# Patient Record
Sex: Female | Born: 1960
Health system: Southern US, Community
[De-identification: ages and names within clinical notes are randomized; demographics above are authoritative.]

## PROBLEM LIST (undated history)

## (undated) DIAGNOSIS — E785 Hyperlipidemia, unspecified: Secondary | ICD-10-CM

## (undated) DIAGNOSIS — E079 Disorder of thyroid, unspecified: Secondary | ICD-10-CM

## (undated) DIAGNOSIS — I10 Essential (primary) hypertension: Secondary | ICD-10-CM

## (undated) DIAGNOSIS — C801 Malignant (primary) neoplasm, unspecified: Secondary | ICD-10-CM

## (undated) DIAGNOSIS — D649 Anemia, unspecified: Secondary | ICD-10-CM

## (undated) DIAGNOSIS — Z8619 Personal history of other infectious and parasitic diseases: Secondary | ICD-10-CM

## (undated) DIAGNOSIS — T7840XA Allergy, unspecified, initial encounter: Secondary | ICD-10-CM

## (undated) HISTORY — DX: Allergy, unspecified, initial encounter: T78.40XA

## (undated) HISTORY — DX: Disorder of thyroid, unspecified: E07.9

## (undated) HISTORY — DX: Hyperlipidemia, unspecified: E78.5

## (undated) HISTORY — DX: Malignant (primary) neoplasm, unspecified: C80.1

## (undated) HISTORY — PX: LAPAROSCOPIC ABDOMINAL EXPLORATION: SHX6249

## (undated) HISTORY — DX: Anemia, unspecified: D64.9

## (undated) HISTORY — DX: Personal history of other infectious and parasitic diseases: Z86.19

---

## 2002-02-17 HISTORY — PX: CHOLECYSTECTOMY: SHX55

## 2004-09-28 ENCOUNTER — Ambulatory Visit: Payer: Self-pay

## 2005-02-08 ENCOUNTER — Ambulatory Visit: Payer: Self-pay | Admitting: General Practice

## 2006-02-14 ENCOUNTER — Ambulatory Visit: Payer: Self-pay | Admitting: General Practice

## 2006-02-22 ENCOUNTER — Ambulatory Visit: Payer: Self-pay | Admitting: General Practice

## 2007-02-13 ENCOUNTER — Ambulatory Visit: Payer: Self-pay | Admitting: General Practice

## 2008-03-04 ENCOUNTER — Ambulatory Visit: Payer: Self-pay | Admitting: General Practice

## 2009-03-10 ENCOUNTER — Ambulatory Visit: Payer: Self-pay | Admitting: Unknown Physician Specialty

## 2010-03-16 ENCOUNTER — Ambulatory Visit: Payer: Self-pay | Admitting: Unknown Physician Specialty

## 2011-03-22 ENCOUNTER — Ambulatory Visit: Payer: Self-pay | Admitting: Unknown Physician Specialty

## 2013-03-01 LAB — HM PAP SMEAR: HM Pap smear: NEGATIVE

## 2013-11-07 ENCOUNTER — Ambulatory Visit: Payer: Self-pay | Admitting: Unknown Physician Specialty

## 2014-04-23 ENCOUNTER — Encounter: Payer: Self-pay | Admitting: *Deleted

## 2014-09-05 ENCOUNTER — Ambulatory Visit (INDEPENDENT_AMBULATORY_CARE_PROVIDER_SITE_OTHER): Payer: 59 | Admitting: Physician Assistant

## 2014-09-05 ENCOUNTER — Encounter: Payer: Self-pay | Admitting: Physician Assistant

## 2014-09-05 VITALS — BP 128/74 | HR 94 | Temp 97.8°F | Resp 12 | Ht 59.5 in | Wt 169.0 lb

## 2014-09-05 DIAGNOSIS — L680 Hirsutism: Secondary | ICD-10-CM | POA: Insufficient documentation

## 2014-09-05 DIAGNOSIS — E786 Lipoprotein deficiency: Secondary | ICD-10-CM | POA: Insufficient documentation

## 2014-09-05 DIAGNOSIS — E282 Polycystic ovarian syndrome: Secondary | ICD-10-CM | POA: Insufficient documentation

## 2014-09-05 DIAGNOSIS — Z7189 Other specified counseling: Secondary | ICD-10-CM | POA: Diagnosis not present

## 2014-09-05 DIAGNOSIS — E039 Hypothyroidism, unspecified: Secondary | ICD-10-CM | POA: Insufficient documentation

## 2014-09-05 DIAGNOSIS — J309 Allergic rhinitis, unspecified: Secondary | ICD-10-CM | POA: Insufficient documentation

## 2014-09-05 DIAGNOSIS — E782 Mixed hyperlipidemia: Secondary | ICD-10-CM

## 2014-09-05 DIAGNOSIS — Z7689 Persons encountering health services in other specified circumstances: Secondary | ICD-10-CM

## 2014-09-05 MED ORDER — GEMFIBROZIL 600 MG PO TABS: 600.0000 mg | ORAL_TABLET | Freq: Two times a day (BID) | ORAL | Status: DC

## 2014-09-05 MED ORDER — SPIRONOLACTONE 50 MG PO TABS: 50.0000 mg | ORAL_TABLET | Freq: Every day | ORAL | Status: DC

## 2014-09-05 NOTE — Progress Notes (Signed)
Patient ID: Gail Hunter, female   DOB: 1960-08-22, 54 y.o.   MRN: [id number]    Subjective:  HPI  Patient is here to get established. She is followed by Dr. Arvil Chaco and Thad Ranger at [facility] for her women's health care. She has regular Pap smears and breast exams there. She does have a history of PCOS and is currently on metformin. This has been well controlled. She also has a history of hyperlipidemia with hypertriglyceridemia. She is on gemfibrozil 600 mg twice daily. She just recently had her labs done through the [place] [facility]. Labs are stable from previous. Her triglycerides are better at 209 , previously 259. She does have low HDL.  She has not been able to get her HDL above 30. Most recent labs HDL was 28. She also has hypothyroidism diagnosed by Dr. Randa Lynn. Most recent labs were stable. She is on levothyroxine 150 g daily.  Prior to Admission medications   Medication Sig Start Date End Date Taking? Authorizing Provider  Cholecalciferol (VITAMIN D3) 2000 UNITS capsule Take by mouth. 12/22/09  Yes Historical Provider, MD  fexofenadine (ALLEGRA) 180 MG tablet Take 180 mg by mouth daily.   Yes Historical Provider, MD  furosemide (LASIX) 20 MG tablet Take 20 mg by mouth daily as needed.  12/22/09  Yes Historical Provider, MD  gemfibrozil (LOPID) 600 MG tablet Take by mouth. 12/22/09  Yes Historical Provider, MD  levothyroxine (SYNTHROID, LEVOTHROID) 150 MCG tablet Take by mouth. 12/22/09  Yes Historical Provider, MD  metFORMIN (GLUCOPHAGE) 1000 MG tablet Take by mouth. 12/22/09  Yes Historical Provider, MD  Multiple Vitamin (MULTI-VITAMINS) TABS Take by mouth. 12/22/09  Yes Historical Provider, MD    There are no active problems to display for this patient.   Past Medical History  Diagnosis Date  . Allergy   . Anemia   . Thyroid disease   . History of chicken pox   . History of measles     Social History   Social History  . Marital Status:  Married    Spouse Name: N/A  . Number of Children: N/A  . Years of Education: N/A   Occupational History  . Not on file.   Social History Main Topics  . Smoking status: Former Smoker -- 0.25 packs/day for 2 years    Types: Cigarettes  . Smokeless tobacco: Never Used  . Alcohol Use: No  . Drug Use: No  . Sexual Activity: Not on file   Other Topics Concern  . Not on file   Social History Narrative  . No narrative on file    No Known Allergies  Review of Systems  Constitutional: Negative.   HENT: Negative.   Eyes: Negative.   Respiratory: Negative.   Cardiovascular: Negative.   Gastrointestinal: Negative.   Genitourinary: Negative.   Musculoskeletal: Negative.   Skin: Negative.   Neurological: Negative.   Endo/Heme/Allergies: Negative.   Psychiatric/Behavioral: Negative.     Objective:  BP 128/74 mmHg  Pulse 94  Temp(Src) 97.8 F (36.6 C)  Resp 12  Ht 4' 11.5" (1.511 m)  Wt 169 lb (76.658 kg)  BMI 33.58 kg/m2  LMP 09/05/2014  Physical Exam  Constitutional: She is oriented to person, place, and time and well-developed, well-nourished, and in no distress. No distress.  HENT:  Head: Normocephalic and atraumatic.  Right Ear: Hearing, tympanic membrane, external ear and ear canal normal.  Left Ear: Hearing, tympanic membrane, external ear and ear canal normal.  Nose:  Nose normal.  Mouth/Throat: Uvula is midline, oropharynx is clear and moist and mucous membranes are normal. No oropharyngeal exudate.  Eyes: Conjunctivae and EOM are normal. Pupils are equal, round, and reactive to light. Right eye exhibits no discharge. Left eye exhibits no discharge. No scleral icterus.  Neck: Normal range of motion. Neck supple. No JVD present. No tracheal deviation present. No thyromegaly present.  Cardiovascular: Normal rate, regular rhythm, normal heart sounds and intact distal pulses.  Exam reveals no gallop and no friction rub.   No murmur heard. Pulmonary/Chest: Effort  normal and breath sounds normal. No respiratory distress. She has no wheezes. She has no rales.  Abdominal: Soft. Bowel sounds are normal. She exhibits no distension and no mass. There is no tenderness. There is no rebound and no guarding.  Musculoskeletal: Normal range of motion. She exhibits no edema or tenderness.  Lymphadenopathy:    She has no cervical adenopathy.  Neurological: She is alert and oriented to person, place, and time. No cranial nerve deficit. Gait normal. Coordination normal.  Skin: Skin is warm and dry. No rash noted. She is not diaphoretic.  Psychiatric: Mood, memory, affect and judgment normal.  Vitals reviewed.    Assessment and Plan :  1. Establishing care with new doctor, encounter for  2. PCOS (polycystic ovarian syndrome)  Has been stable with metformin. She is followed by Dr. Arvil Chaco. She had been previously on Aldactone with oral contraceptives and metformin for the PCOS and hirsutism. She discontinued the Aldactone and oral contraceptives a few years ago. She is interested in restarting the Aldactone for the hirsutism of the face but does not wish to restart oral contraceptives. She is aware of the risk associated with taking Aldactone and pregnancy. She is not menopausal yet. - spironolactone (ALDACTONE) 50 MG tablet; Take 1 tablet (50 mg total) by mouth daily.  Dispense: 90 tablet; Refill: 4  3. Hypercholesterolemia with hypertriglyceridemia Stable. Continue gemfibrozil as below. Will recheck in 6 months. - gemfibrozil (LOPID) 600 MG tablet; Take 1 tablet (600 mg total) by mouth 2 (two) times daily before a meal.  Dispense: 120 tablet; Refill: 4  4. Hirsutism Secondary to PCOS. She would like to try the Aldactone again without the oral contraceptive. She was advised of pregnancy risk factors since she is still having a regular menstrual cycle. She voiced understanding of the precautions that would be necessary to prevent pregnancy.  Aldactone prescribed as  below. We'll recheck in 6 months. - spironolactone (ALDACTONE) 50 MG tablet; Take 1 tablet (50 mg total) by mouth daily.  Dispense: 90 tablet; Refill: 4  5. Hypothyroidism, unspecified hypothyroidism type Stable on levothyroxine 150 g. Most recent labs were stable. We'll recheck in 6 months.  6. Low HDL (under 40) Most recent HDL was 28. This is a stable reading for her.  She does mention that her brother and mother both have low HDLs as well.  Advised to try to add some daily exercise some increase. Will recheck labs in 6 months.

## 2014-09-05 NOTE — Patient Instructions (Signed)

## 2014-09-17 ENCOUNTER — Encounter: Payer: Self-pay | Admitting: Physician Assistant

## 2015-04-24 DIAGNOSIS — Z01419 Encounter for gynecological examination (general) (routine) without abnormal findings: Secondary | ICD-10-CM | POA: Diagnosis not present

## 2015-09-08 ENCOUNTER — Other Ambulatory Visit: Payer: Self-pay | Admitting: Physician Assistant

## 2015-09-08 DIAGNOSIS — L680 Hirsutism: Secondary | ICD-10-CM

## 2015-09-08 DIAGNOSIS — E282 Polycystic ovarian syndrome: Secondary | ICD-10-CM

## 2015-09-18 DIAGNOSIS — H524 Presbyopia: Secondary | ICD-10-CM | POA: Diagnosis not present

## 2015-10-16 ENCOUNTER — Ambulatory Visit (INDEPENDENT_AMBULATORY_CARE_PROVIDER_SITE_OTHER): Payer: 59 | Admitting: Physician Assistant

## 2015-10-16 ENCOUNTER — Encounter: Payer: Self-pay | Admitting: Physician Assistant

## 2015-10-16 VITALS — BP 112/70 | HR 74 | Temp 98.3°F | Resp 16 | Ht 60.0 in | Wt 173.0 lb

## 2015-10-16 DIAGNOSIS — Z1211 Encounter for screening for malignant neoplasm of colon: Secondary | ICD-10-CM | POA: Diagnosis not present

## 2015-10-16 DIAGNOSIS — Z1239 Encounter for other screening for malignant neoplasm of breast: Secondary | ICD-10-CM | POA: Diagnosis not present

## 2015-10-16 DIAGNOSIS — Z0000 Encounter for general adult medical examination without abnormal findings: Secondary | ICD-10-CM

## 2015-10-16 LAB — POCT URINALYSIS DIPSTICK
BILIRUBIN UA: NEGATIVE
GLUCOSE UA: NEGATIVE
KETONES UA: NEGATIVE
Leukocytes, UA: NEGATIVE
Nitrite, UA: NEGATIVE
Protein, UA: NEGATIVE
RBC UA: NEGATIVE
SPEC GRAV UA: 1.01
pH, UA: 5

## 2015-10-16 NOTE — Patient Instructions (Signed)

## 2015-10-16 NOTE — Progress Notes (Signed)
Patient: Gail Hunter, Female    DOB: 12-04-1960, 55 y.o.   MRN: [id number] Visit Date: 10/16/2015  Today's Provider: Margaretann Loveless, PA-C   Chief Complaint  Patient presents with  . Annual Exam   Subjective:    Annual physical exam Gail Hunter is a 55 y.o. female who presents today for health maintenance and complete physical. She feels well. She reports exercising 3 days a week. She reports she is sleeping well. 04/17: Pap-neg per pt Dr. Haskel Khan 01/07/14:  Mammogram-BI-RADS 1 -----------------------------------------------------------------   Review of Systems  Constitutional: Negative.   HENT: Negative.   Eyes: Negative.   Respiratory: Negative.   Cardiovascular: Negative.   Gastrointestinal: Negative.   Endocrine: Negative.   Genitourinary: Negative.   Musculoskeletal: Negative.   Skin: Negative.   Allergic/Immunologic: Negative.   Neurological: Negative.   Hematological: Negative.   Psychiatric/Behavioral: Negative.     Social History      She  reports that she quit smoking about 15 years ago. Her smoking use included Cigarettes. She has a 0.50 pack-year smoking history. She has never used smokeless tobacco. She reports that she does not drink alcohol or use drugs.       Social History   Social History  . Marital status: Married    Spouse name: N/A  . Number of children: 1  . Years of education: N/A   Social History Main Topics  . Smoking status: Former Smoker    Packs/day: 0.25    Years: 2.00    Types: Cigarettes    Quit date: 01/17/2000  . Smokeless tobacco: Never Used  . Alcohol use No  . Drug use: No  . Sexual activity: Not Asked   Other Topics Concern  . None   Social History Narrative  . None    Past Medical History:  Diagnosis Date  . Allergy   . Anemia   . History of chicken pox   . History of measles   . Thyroid disease      Patient Active Problem List   Diagnosis Date Noted  . PCOS (polycystic ovarian  syndrome) 09/05/2014  . Hypercholesterolemia with hypertriglyceridemia 09/05/2014  . Hirsutism 09/05/2014  . Hypothyroidism 09/05/2014  . Allergic rhinitis 09/05/2014  . Low HDL (under 40) 09/05/2014    Past Surgical History:  Procedure Laterality Date  . CESAREAN SECTION    . CHOLECYSTECTOMY  02/2002  . LAPAROSCOPIC ABDOMINAL EXPLORATION      Family History        Family Status  Relation Status  . Mother Alive  . Father Alive  . Sister Alive  . Brother Alive  . Daughter Alive        Her family history includes Alzheimer's disease in her father and mother; Diabetes in her sister; Hyperlipidemia in her brother; Thyroid disease in her mother.    No Known Allergies  Current Meds  Medication Sig  . cetirizine (ZYRTEC) 10 MG tablet Take 10 mg by mouth daily.  . metformin (FORTAMET) 1000 MG (OSM) 24 hr tablet TAKE 1 TABLET BY MOUTH ONCE DAILY WITH BREAKFAST    Patient Care Team: Margaretann Loveless, PA-C as PCP - General (Physician Assistant)     Objective:   Vitals: BP 112/70 (BP Location: Left Arm, Patient Position: Sitting, Cuff Size: Large)   Pulse 74   Temp 98.3 F (36.8 C) (Oral)   Resp 16   Ht 5' (1.524 m)   Wt 173 lb (78.5 kg)  SpO2 97%   BMI 33.79 kg/m    Physical Exam  Constitutional: She is oriented to person, place, and time. She appears well-developed and well-nourished. No distress.  HENT:  Head: Normocephalic and atraumatic.  Right Ear: Tympanic membrane, external ear and ear canal normal.  Left Ear: Tympanic membrane, external ear and ear canal normal.  Nose: Nose normal.  Mouth/Throat: Uvula is midline, oropharynx is clear and moist and mucous membranes are normal. No oropharyngeal exudate.  Eyes: Conjunctivae and EOM are normal. Pupils are equal, round, and reactive to light. Right eye exhibits no discharge. Left eye exhibits no discharge. No scleral icterus.  Neck: Normal range of motion. Neck supple. No JVD present. Carotid bruit is not  present. No tracheal deviation present. No thyromegaly present.  Cardiovascular: Normal rate, regular rhythm, normal heart sounds and intact distal pulses.  Exam reveals no gallop and no friction rub.   No murmur heard. Pulmonary/Chest: Effort normal and breath sounds normal. No respiratory distress. She has no wheezes. She has no rales. She exhibits no tenderness. Right breast exhibits no inverted nipple, no mass, no nipple discharge, no skin change and no tenderness. Left breast exhibits no inverted nipple, no mass, no nipple discharge, no skin change and no tenderness. Breasts are symmetrical.  Abdominal: Soft. Bowel sounds are normal. She exhibits no distension and no mass. There is no tenderness. There is no rebound and no guarding.  Musculoskeletal: Normal range of motion. She exhibits no edema or tenderness.  Lymphadenopathy:    She has no cervical adenopathy.  Neurological: She is alert and oriented to person, place, and time.  Skin: Skin is warm and dry. No rash noted. She is not diaphoretic.  Psychiatric: She has a normal mood and affect. Her behavior is normal. Judgment and thought content normal.  Vitals reviewed.  Depression Screen PHQ 2/9 Scores 10/16/2015  PHQ - 2 Score 0    Assessment & Plan:     Routine Health Maintenance and Physical Exam  Exercise Activities and Dietary recommendations Goals    None       There is no immunization history on file for this patient.  Health Maintenance  Topic Date Due  . Hepatitis C Screening  06-Feb-1960  . HIV Screening  11/21/1976  . TETANUS/TDAP  11/21/1980  . PAP SMEAR  11/22/1982  . MAMMOGRAM  11/22/2011  . COLONOSCOPY  11/22/2011  . INFLUENZA VACCINE  08/18/2015      Discussed health benefits of physical activity, and encouraged her to engage in regular exercise appropriate for her age and condition.    1. Annual physical exam UA was normal today in the office. Labs previously done and discussed. I will see her back  in 1 year. She is to call if she develops any acute issue, question or concern in the meantime. - POCT urinalysis dipstick  2. Breast cancer screening Breast exam today was normal. There is no family history of breast cancer. She does perform regular self breast exams. Mammogram was ordered as below. [facility] was given to patient so she may schedule her mammogram at her convenience. - MM Digital Screening; Future  3. Colon cancer screening - Ambulatory referral to Gastroenterology  --------------------------------------------------------------------    Margaretann LovelessJennifer M Stefan Markarian, PA-C  [facility]

## 2015-10-20 ENCOUNTER — Encounter: Payer: Self-pay | Admitting: Physician Assistant

## 2015-10-23 ENCOUNTER — Encounter: Payer: Self-pay | Admitting: Physician Assistant

## 2015-11-19 DIAGNOSIS — Z1211 Encounter for screening for malignant neoplasm of colon: Secondary | ICD-10-CM | POA: Diagnosis not present

## 2015-12-18 ENCOUNTER — Ambulatory Visit
Admission: RE | Admit: 2015-12-18 | Discharge: 2015-12-18 | Disposition: A | Discharge status: Home / Self Care | Payer: 59 | Source: Ambulatory Visit | Attending: Physician Assistant | Admitting: Physician Assistant

## 2015-12-18 DIAGNOSIS — Z1231 Encounter for screening mammogram for malignant neoplasm of breast: Secondary | ICD-10-CM | POA: Diagnosis not present

## 2015-12-18 DIAGNOSIS — Z1239 Encounter for other screening for malignant neoplasm of breast: Secondary | ICD-10-CM

## 2016-01-04 ENCOUNTER — Other Ambulatory Visit: Payer: Self-pay | Admitting: Physician Assistant

## 2016-01-04 DIAGNOSIS — E782 Mixed hyperlipidemia: Secondary | ICD-10-CM

## 2016-03-31 ENCOUNTER — Ambulatory Visit: Admit: 2016-03-31 | Payer: 59 | Admitting: Gastroenterology

## 2016-03-31 DIAGNOSIS — Z1211 Encounter for screening for malignant neoplasm of colon: Secondary | ICD-10-CM | POA: Diagnosis not present

## 2016-03-31 DIAGNOSIS — K648 Other hemorrhoids: Secondary | ICD-10-CM | POA: Diagnosis not present

## 2016-03-31 LAB — HM COLONOSCOPY

## 2016-03-31 SURGERY — COLONOSCOPY WITH PROPOFOL
Anesthesia: General

## 2016-07-14 ENCOUNTER — Telehealth: Payer: Self-pay | Admitting: Physician Assistant

## 2016-07-14 DIAGNOSIS — E282 Polycystic ovarian syndrome: Secondary | ICD-10-CM

## 2016-07-14 MED ORDER — METFORMIN HCL ER 500 MG PO TB24: 1000.0000 mg | ORAL_TABLET | Freq: Every day | ORAL | 3 refills | Status: DC

## 2016-07-14 NOTE — Telephone Encounter (Signed)
Metformin ER 1000mg  not covered. Will send in metformin ER 500mg  to take 2 tabs po daily as this is covered.

## 2016-09-22 ENCOUNTER — Other Ambulatory Visit: Payer: Self-pay | Admitting: Physician Assistant

## 2016-09-22 DIAGNOSIS — L680 Hirsutism: Secondary | ICD-10-CM

## 2016-09-22 DIAGNOSIS — E282 Polycystic ovarian syndrome: Secondary | ICD-10-CM

## 2016-10-21 ENCOUNTER — Ambulatory Visit (INDEPENDENT_AMBULATORY_CARE_PROVIDER_SITE_OTHER): Payer: 59 | Admitting: Physician Assistant

## 2016-10-21 ENCOUNTER — Encounter: Payer: Self-pay | Admitting: Physician Assistant

## 2016-10-21 VITALS — BP 120/70 | HR 92 | Temp 98.3°F | Resp 16 | Ht 60.0 in | Wt 174.2 lb

## 2016-10-21 DIAGNOSIS — E782 Mixed hyperlipidemia: Secondary | ICD-10-CM | POA: Diagnosis not present

## 2016-10-21 DIAGNOSIS — Z0000 Encounter for general adult medical examination without abnormal findings: Secondary | ICD-10-CM

## 2016-10-21 DIAGNOSIS — E039 Hypothyroidism, unspecified: Secondary | ICD-10-CM | POA: Diagnosis not present

## 2016-10-21 DIAGNOSIS — Z1211 Encounter for screening for malignant neoplasm of colon: Secondary | ICD-10-CM

## 2016-10-21 DIAGNOSIS — Z1239 Encounter for other screening for malignant neoplasm of breast: Secondary | ICD-10-CM

## 2016-10-21 DIAGNOSIS — E282 Polycystic ovarian syndrome: Secondary | ICD-10-CM

## 2016-10-21 DIAGNOSIS — Z1231 Encounter for screening mammogram for malignant neoplasm of breast: Secondary | ICD-10-CM | POA: Diagnosis not present

## 2016-10-21 NOTE — Patient Instructions (Signed)

## 2016-10-21 NOTE — Progress Notes (Signed)
Patient: Gail Hunter, Female    DOB: 1960-04-29, 56 y.o.   MRN: [id number] Visit Date: 10/21/2016  Today's Provider: Margaretann Loveless, PA-C   Chief Complaint  Patient presents with  . Annual Exam   Subjective:    Annual physical exam Gail Hunter is a 56 y.o. female who presents today for health maintenance and complete physical. She feels well. She reports exercising. She reports she is sleeping well. Per patient she had Colonoscopy done 03/31/2016 @ [facility]. She reports that she has not been to Gyn because Dr.VanDalen has retired. She will switch her GYN care to here now. ----------------------------------------------------------------- Flu Vaccine: At the hospital 10/11/2016 Tdap: 09/2016- Labs reviewed.  Review of Systems  Constitutional: Negative.   HENT: Negative.   Eyes: Negative.   Respiratory: Negative.   Cardiovascular: Negative.   Gastrointestinal: Negative.   Endocrine: Negative.   Genitourinary: Negative.   Musculoskeletal: Negative.   Skin: Negative.   Allergic/Immunologic: Negative.   Neurological: Negative.   Hematological: Negative.   Psychiatric/Behavioral: Negative.     Social History      She  reports that she quit smoking about 16 years ago. Her smoking use included Cigarettes. She has a 0.50 pack-year smoking history. She has never used smokeless tobacco. She reports that she does not drink alcohol or use drugs.       Social History   Social History  . Marital status: Married    Spouse name: N/A  . Number of children: 1  . Years of education: N/A   Social History Main Topics  . Smoking status: Former Smoker    Packs/day: 0.25    Years: 2.00    Types: Cigarettes    Quit date: 01/17/2000  . Smokeless tobacco: Never Used  . Alcohol use No  . Drug use: No  . Sexual activity: Not Asked   Other Topics Concern  . None   Social History Narrative  . None    Past Medical History:  Diagnosis Date  . Allergy    . Anemia   . History of chicken pox   . History of measles   . Thyroid disease      Patient Active Problem List   Diagnosis Date Noted  . PCOS (polycystic ovarian syndrome) 09/05/2014  . Hypercholesterolemia with hypertriglyceridemia 09/05/2014  . Hirsutism 09/05/2014  . Hypothyroidism 09/05/2014  . Allergic rhinitis 09/05/2014  . Low HDL (under 40) 09/05/2014    Past Surgical History:  Procedure Laterality Date  . CESAREAN SECTION    . CHOLECYSTECTOMY  02/2002  . LAPAROSCOPIC ABDOMINAL EXPLORATION      Family History        Family Status  Relation Status  . Mother Alive  . Father Deceased       died 02/05/16  . Sister Alive  . Brother Alive  . Daughter Alive        Her family history includes Alzheimer's disease in her father and mother; Diabetes in her sister; Hyperlipidemia in her brother; Thyroid disease in her mother.     No Known Allergies   Current Outpatient Prescriptions:  .  cetirizine (ZYRTEC) 10 MG tablet, Take 10 mg by mouth daily., Disp: , Rfl:  .  Cholecalciferol (VITAMIN D3) 5000 units TABS, Take by mouth., Disp: , Rfl:  .  gemfibrozil (LOPID) 600 MG tablet, TAKE 1 TABLET BY MOUTH 2 TIMES DAILY BEFORE A MEAL., Disp: 180 tablet, Rfl: 3 .  levothyroxine (SYNTHROID, LEVOTHROID)  150 MCG tablet, Take 150 mcg by mouth daily before breakfast. , Disp: , Rfl:  .  metFORMIN (GLUCOPHAGE-XR) 500 MG 24 hr tablet, Take 2 tablets (1,000 mg total) by mouth daily with breakfast., Disp: 180 tablet, Rfl: 3 .  Multiple Vitamin (MULTIVITAMINS PO), Take by mouth., Disp: , Rfl:  .  spironolactone (ALDACTONE) 50 MG tablet, TAKE 1 TABLET BY MOUTH DAILY., Disp: 90 tablet, Rfl: 4   Patient Care Team: Margaretann Loveless, PA-C as PCP - General (Physician Assistant)      Objective:   Vitals: BP 120/70 (BP Location: Left Arm, Patient Position: Sitting, Cuff Size: Normal)   Pulse 92   Temp 98.3 F (36.8 C) (Oral)   Resp 16   Ht 5' (1.524 m)   Wt 174 lb 3.2 oz (79 kg)    BMI 34.02 kg/m      Physical Exam  Constitutional: She is oriented to person, place, and time. She appears well-developed and well-nourished. No distress.  HENT:  Head: Normocephalic and atraumatic.  Right Ear: Hearing, tympanic membrane, external ear and ear canal normal.  Left Ear: Hearing, tympanic membrane, external ear and ear canal normal.  Nose: Nose normal.  Mouth/Throat: Uvula is midline, oropharynx is clear and moist and mucous membranes are normal. No oropharyngeal exudate.  Eyes: Pupils are equal, round, and reactive to light. Conjunctivae and EOM are normal. Right eye exhibits no discharge. Left eye exhibits no discharge. No scleral icterus.  Neck: Normal range of motion. Neck supple. No JVD present. Carotid bruit is not present. No tracheal deviation present. No thyromegaly present.  Cardiovascular: Normal rate, regular rhythm, normal heart sounds and intact distal pulses.  Exam reveals no gallop and no friction rub.   No murmur heard. Pulmonary/Chest: Effort normal and breath sounds normal. No respiratory distress. She has no wheezes. She has no rales. She exhibits no tenderness. Right breast exhibits no inverted nipple, no mass, no nipple discharge, no skin change and no tenderness. Left breast exhibits no inverted nipple, no mass, no nipple discharge, no skin change and no tenderness. Breasts are symmetrical.  Abdominal: Soft. Bowel sounds are normal. She exhibits no distension and no mass. There is no tenderness. There is no rebound and no guarding.  Musculoskeletal: Normal range of motion. She exhibits no edema or tenderness.  Lymphadenopathy:    She has no cervical adenopathy.  Neurological: She is alert and oriented to person, place, and time. She has normal reflexes.  Skin: Skin is warm and dry. No rash noted. She is not diaphoretic.  Psychiatric: She has a normal mood and affect. Her behavior is normal. Judgment and thought content normal.  Vitals  reviewed.    Depression Screen PHQ 2/9 Scores 10/21/2016 10/16/2015  PHQ - 2 Score 0 0      Assessment & Plan:     Routine Health Maintenance and Physical Exam  Exercise Activities and Dietary recommendations Goals    None       There is no immunization history on file for this patient.  Health Maintenance  Topic Date Due  . Hepatitis C Screening  01/13/1961  . HIV Screening  11/21/1976  . TETANUS/TDAP  11/21/1980  . PAP SMEAR  11/22/1982  . COLONOSCOPY  11/22/2011  . INFLUENZA VACCINE  08/17/2016  . MAMMOGRAM  12/17/2017     Discussed health benefits of physical activity, and encouraged her to engage in regular exercise appropriate for her age and condition.   1. Annual physical exam Normal physical exam  today. Labs reviewed today and will be scanned in chart.   2. Breast cancer screening Breast exam today was normal. There is no family history of breast cancer. She does perform regular self breast exams. Mammogram was ordered as below. Information for [facility] was given to patient so she may schedule her mammogram at her convenience. - MM Digital Screening; Future  3. Colon cancer screening Done with Dr. Mechele Collin at [facility]. Colonoscopy report requested.  4. PCOS (polycystic ovarian syndrome) Stable with metformin and spironolactone. Will check FSH and LH to see if perimenopausal. Patient has not had menstrual cycle since March 2018.  5. Hypothyroidism, unspecified type TSH was elevated at 8.900. Patient does admit to not taking regularly. Currently on levothyroxine . She will have TSH rechecked in 4-6 weeks to see if it returns to normal. If not back to normal will increase dose to .   6. Hypercholesterolemia with hypertriglyceridemia Slight elevation when compared to last year. Patient admits to only taking Gemfibrozil once daily instead of twice daily. She is trying to get better at taking more regularly BID. Also continues to work  on lifestyle modifications with healthy dieting and exercise.   --------------------------------------------------------------------    Margaretann Loveless, PA-C  [facility]

## 2016-10-28 ENCOUNTER — Encounter: Payer: Self-pay | Admitting: Physician Assistant

## 2016-10-31 ENCOUNTER — Encounter: Payer: Self-pay | Admitting: Physician Assistant

## 2016-11-01 ENCOUNTER — Encounter: Payer: Self-pay | Admitting: Physician Assistant

## 2016-11-16 ENCOUNTER — Telehealth: Payer: Self-pay | Admitting: Physician Assistant

## 2016-11-16 NOTE — Telephone Encounter (Signed)
EROR/MW

## 2016-12-07 ENCOUNTER — Other Ambulatory Visit: Payer: Self-pay | Admitting: Physician Assistant

## 2016-12-07 MED ORDER — LEVOTHYROXINE SODIUM 150 MCG PO TABS: 137.0000 ug | ORAL_TABLET | Freq: Every day | ORAL | 12 refills | Status: DC

## 2016-12-07 NOTE — Telephone Encounter (Signed)
[facility] fax[facility] request for the following medication. Thanks CC  levothyroxine (SYNTHROID, LEVOTHROID) 150 MCG tablet

## 2017-04-24 ENCOUNTER — Telehealth: Payer: Self-pay | Admitting: Physician Assistant

## 2017-04-24 NOTE — Telephone Encounter (Signed)
Faxed ROI to [facility] on 10.18.18 / 11/08/2016 and 11/10/2016

## 2017-10-27 ENCOUNTER — Encounter: Payer: Self-pay | Admitting: Physician Assistant

## 2017-11-02 DIAGNOSIS — H524 Presbyopia: Secondary | ICD-10-CM | POA: Diagnosis not present

## 2017-11-03 ENCOUNTER — Other Ambulatory Visit (HOSPITAL_COMMUNITY)
Admission: RE | Admit: 2017-11-03 | Discharge: 2017-11-03 | Disposition: A | Discharge status: Home / Self Care | Payer: 59 | Source: Ambulatory Visit | Attending: Physician Assistant | Admitting: Physician Assistant

## 2017-11-03 ENCOUNTER — Ambulatory Visit (INDEPENDENT_AMBULATORY_CARE_PROVIDER_SITE_OTHER): Payer: 59 | Admitting: Physician Assistant

## 2017-11-03 ENCOUNTER — Encounter: Payer: Self-pay | Admitting: Physician Assistant

## 2017-11-03 VITALS — BP 128/70 | HR 85 | Temp 98.2°F | Resp 16 | Wt 170.8 lb

## 2017-11-03 DIAGNOSIS — Z124 Encounter for screening for malignant neoplasm of cervix: Secondary | ICD-10-CM

## 2017-11-03 DIAGNOSIS — Z0000 Encounter for general adult medical examination without abnormal findings: Secondary | ICD-10-CM | POA: Diagnosis not present

## 2017-11-03 DIAGNOSIS — R7989 Other specified abnormal findings of blood chemistry: Secondary | ICD-10-CM | POA: Diagnosis not present

## 2017-11-03 DIAGNOSIS — Z1239 Encounter for other screening for malignant neoplasm of breast: Secondary | ICD-10-CM | POA: Diagnosis not present

## 2017-11-03 DIAGNOSIS — E039 Hypothyroidism, unspecified: Secondary | ICD-10-CM | POA: Diagnosis not present

## 2017-11-03 MED ORDER — LEVOTHYROXINE SODIUM 137 MCG PO TABS: 137.0000 ug | ORAL_TABLET | Freq: Every day | ORAL | 1 refills | Status: DC

## 2017-11-03 NOTE — Progress Notes (Signed)
Patient: Gail Hunter, Female    DOB: 1960/03/23, 57 y.o.   MRN: [id number] Visit Date: 11/03/2017  Today's Provider: Margaretann Loveless, PA-C   Chief Complaint  Patient presents with  . Annual Exam   Subjective:    Annual physical exam [name] is a 57 y.o. female who presents today for health maintenance and complete physical. She feels well. She reports exercising since August been walking 2-3 days. She reports she is sleeping well.  Labs reviewed from [place] [facility] [place]. Thyroid slightly overcorrected. Triglycerides improving with lifestyle changes. Creatinine slightly increased from baseline.  -----------------------------------------------------------------   Review of Systems  Constitutional: Positive for activity change.  HENT: Negative.   Eyes: Negative.   Respiratory: Negative.   Cardiovascular: Negative.   Gastrointestinal: Negative.   Endocrine: Negative.   Genitourinary: Negative.   Musculoskeletal: Negative.   Skin: Negative.   Allergic/Immunologic: Positive for environmental allergies.  Neurological: Negative.   Hematological: Negative.   Psychiatric/Behavioral: Negative.     Social History      She  reports that she quit smoking about 17 years ago. Her smoking use included cigarettes. She has a 0.50 pack-year smoking history. She has never used smokeless tobacco. She reports that she does not drink alcohol or use drugs.       Social History   Socioeconomic History  . Marital status: Married    Spouse name: Not on file  . Number of children: 1  . Years of education: Not on file  . Highest education level: Not on file  Occupational History  . Not on file  Social Needs  . Financial resource strain: Not on file  . Food insecurity:    Worry: Not on file    Inability: Not on file  . Transportation needs:    Medical: Not on file    Non-medical: Not on file  Tobacco Use  . Smoking status: Former Smoker    Packs/day: 0.25    Years:  2.00    Pack years: 0.50    Types: Cigarettes    Last attempt to quit: 01/17/2000    Years since quitting: 17.8  . Smokeless tobacco: Never Used  Substance and Sexual Activity  . Alcohol use: No  . Drug use: No  . Sexual activity: Not on file  Lifestyle  . Physical activity:    Days per week: Not on file    Minutes per session: Not on file  . Stress: Not on file  Relationships  . Social connections:    Talks on phone: Not on file    Gets together: Not on file    Attends religious service: Not on file    Active member of club or organization: Not on file    Attends meetings of clubs or organizations: Not on file    Relationship status: Not on file  Other Topics Concern  . Not on file  Social History Narrative  . Not on file    Past Medical History:  Diagnosis Date  . Allergy   . Anemia   . History of chicken pox   . History of measles   . Thyroid disease      Patient Active Problem List   Diagnosis Date Noted  . PCOS (polycystic ovarian syndrome) 09/05/2014  . Hypercholesterolemia with hypertriglyceridemia 09/05/2014  . Hirsutism 09/05/2014  . Hypothyroidism 09/05/2014  . Allergic rhinitis 09/05/2014  . Low HDL (under 40) 09/05/2014    Past Surgical History:  Procedure Laterality Date  . CESAREAN SECTION    . CHOLECYSTECTOMY  02/2002  . LAPAROSCOPIC ABDOMINAL EXPLORATION      Family History        Family Status  Relation Name Status  . Mother  Alive  . Father  Deceased       died February 03, 2016  . Sister  Alive  . Brother  Alive  . Daughter  Alive        Her family history includes Alzheimer's disease in her father and mother; Diabetes in her sister; Hyperlipidemia in her brother; Thyroid disease in her mother.      No Known Allergies   Current Outpatient Medications:  .  B Complex Vitamins (B COMPLEX PO), Take by mouth., Disp: , Rfl:  .  cetirizine (ZYRTEC) 10 MG tablet, Take 10 mg by mouth daily., Disp: , Rfl:  .  Cholecalciferol (VITAMIN D3) 5000  units TABS, Take by mouth., Disp: , Rfl:  .  gemfibrozil (LOPID) 600 MG tablet, TAKE 1 TABLET BY MOUTH 2 TIMES DAILY BEFORE A MEAL., Disp: 180 tablet, Rfl: 3 .  levothyroxine (SYNTHROID, LEVOTHROID) 150 MCG tablet, Take 1 tablet (150 mcg total) by mouth daily before breakfast., Disp: 30 tablet, Rfl: 12 .  metFORMIN (GLUCOPHAGE-XR) 500 MG 24 hr tablet, Take 2 tablets (1,000 mg total) by mouth daily with breakfast., Disp: 180 tablet, Rfl: 3 .  Multiple Vitamin (MULTIVITAMINS PO), Take by mouth., Disp: , Rfl:  .  Omega-3 Fatty Acids (FISH OIL) 1200 MG CAPS, Take by mouth., Disp: , Rfl:  .  spironolactone (ALDACTONE) 50 MG tablet, TAKE 1 TABLET BY MOUTH DAILY., Disp: 90 tablet, Rfl: 4   Patient Care Team: Margaretann Loveless, PA-C as PCP - General (Physician Assistant)      Objective:   Vitals: BP 128/70 (BP Location: Left Arm, Patient Position: Sitting, Cuff Size: Normal)   Pulse 85   Temp 98.2 F (36.8 C) (Oral)   Resp 16   Wt 170 lb 12.8 oz (77.5 kg)   LMP 03/17/2016   SpO2 99%   BMI 33.36 kg/m    Vitals:   11/03/17 1413  BP: 128/70  Pulse: 85  Resp: 16  Temp: 98.2 F (36.8 C)  TempSrc: Oral  SpO2: 99%  Weight: 170 lb 12.8 oz (77.5 kg)     Physical Exam  Constitutional: She is oriented to person, place, and time. She appears well-developed and well-nourished. No distress.  HENT:  Head: Normocephalic and atraumatic.  Right Ear: Hearing, tympanic membrane, external ear and ear canal normal.  Left Ear: Hearing, tympanic membrane, external ear and ear canal normal.  Nose: Nose normal.  Mouth/Throat: Uvula is midline, oropharynx is clear and moist and mucous membranes are normal. No oropharyngeal exudate.  Eyes: Pupils are equal, round, and reactive to light. Conjunctivae and EOM are normal. Right eye exhibits no discharge. Left eye exhibits no discharge. No scleral icterus.  Neck: Normal range of motion. Neck supple. No JVD present. Carotid bruit is not present. No tracheal  deviation present. No thyromegaly present.  Cardiovascular: Normal rate, regular rhythm, normal heart sounds and intact distal pulses. Exam reveals no gallop and no friction rub.  No murmur heard. Pulmonary/Chest: Effort normal and breath sounds normal. No respiratory distress. She has no wheezes. She has no rales. She exhibits no tenderness. Right breast exhibits no inverted nipple, no mass, no nipple discharge, no skin change and no tenderness. Left breast exhibits no inverted nipple, no mass, no nipple discharge, no  skin change and no tenderness. No breast tenderness, discharge or bleeding. Breasts are symmetrical.  Abdominal: Soft. Bowel sounds are normal. She exhibits no distension and no mass. There is no tenderness. There is no rebound and no guarding. Hernia confirmed negative in the right inguinal area and confirmed negative in the left inguinal area.  Genitourinary: Rectum normal, vagina normal and uterus normal. No breast tenderness, discharge or bleeding. Pelvic exam was performed with patient supine. There is no rash, tenderness, lesion or injury on the right labia. There is no rash, tenderness, lesion or injury on the left labia. Cervix exhibits no motion tenderness, no discharge and no friability. Right adnexum displays no mass, no tenderness and no fullness. Left adnexum displays no mass, no tenderness and no fullness. No erythema, tenderness or bleeding in the vagina. No signs of injury around the vagina. No vaginal discharge found.  Musculoskeletal: Normal range of motion. She exhibits no edema or tenderness.  Lymphadenopathy:    She has no cervical adenopathy.       Right: No inguinal adenopathy present.       Left: No inguinal adenopathy present.  Neurological: She is alert and oriented to person, place, and time. She has normal reflexes. No cranial nerve deficit. Coordination normal.  Skin: Skin is warm and dry. No rash noted. She is not diaphoretic.  Psychiatric: She has a normal  mood and affect. Her behavior is normal. Judgment and thought content normal.  Vitals reviewed.    Depression Screen PHQ 2/9 Scores 11/03/2017 10/21/2016 10/16/2015  PHQ - 2 Score 0 0 0      Assessment & Plan:     Routine Health Maintenance and Physical Exam  Exercise Activities and Dietary recommendations Goals   None     Immunization History  Administered Date(s) Administered  . Influenza-Unspecified 10/11/2016, 10/10/2017  . Td 09/13/2016  . Tdap 09/13/2016    Health Maintenance  Topic Date Due  . Hepatitis C Screening  1960/02/14  . HIV Screening  11/21/1976  . PAP SMEAR  03/01/2016  . MAMMOGRAM  12/17/2017  . COLONOSCOPY  04/01/2026  . TETANUS/TDAP  09/14/2026  . INFLUENZA VACCINE  Completed     Discussed health benefits of physical activity, and encouraged her to engage in regular exercise appropriate for her age and condition.    1. Encounter for annual physical exam Normal physical exam today. Will check labs as below and f/u pending lab results. If labs are stable and WNL she will not need to have these rechecked for one year at her next annual physical exam. She is to call the office in the meantime if she has any acute issue, questions or concerns.  2. Breast cancer screening Breast exam today was normal. There is no family history of breast cancer. She does perform regular self breast exams. Mammogram was ordered as below. Information for [facility] was given to patient so she may schedule her mammogram at her convenience. - MM 3D SCREEN BREAST BILATERAL; Future  3. Screening for cervical cancer Pap collected today. Will send as below and f/u pending results. - Cytology - PAP  4. Hypothyroidism, unspecified type Decrease levothyroxine to as below from due to slight overcorrection noted on labs. Will recheck in 4-6 weeks.  - levothyroxine (SYNTHROID, LEVOTHROID) 137 MCG tablet; Take 1 tablet (137 mcg total) by mouth daily  before breakfast.  Dispense: 90 tablet; Refill: 1  5. Elevated serum creatinine Creatinine of 1.07 noted on labs from 10/27/17. Advised  to push fluids and recheck non-fasting in 4-6 weeks with thyroid.   --------------------------------------------------------------------    Margaretann Loveless, PA-C  [facility]

## 2017-11-03 NOTE — Patient Instructions (Signed)
Health Maintenance for Postmenopausal Women Menopause is a normal process in which your reproductive ability comes to an end. This process happens gradually over a span of months to years, usually between the ages of 22 and 9. Menopause is complete when you have missed 12 consecutive menstrual periods. It is important to talk with your health care provider about some of the most common conditions that affect postmenopausal women, such as heart disease, cancer, and bone loss (osteoporosis). Adopting a healthy lifestyle and getting preventive care can help to promote your health and wellness. Those actions can also lower your chances of developing some of these common conditions. What should I know about menopause? During menopause, you may experience a number of symptoms, such as:  Moderate-to-severe hot flashes.  Night sweats.  Decrease in sex drive.  Mood swings.  Headaches.  Tiredness.  Irritability.  Memory problems.  Insomnia.  Choosing to treat or not to treat menopausal changes is an individual decision that you make with your health care provider. What should I know about hormone replacement therapy and supplements? Hormone therapy products are effective for treating symptoms that are associated with menopause, such as hot flashes and night sweats. Hormone replacement carries certain risks, especially as you become older. If you are thinking about using estrogen or estrogen with progestin treatments, discuss the benefits and risks with your health care provider. What should I know about heart disease and stroke? Heart disease, heart attack, and stroke become more likely as you age. This may be due, in part, to the hormonal changes that your body experiences during menopause. These can affect how your body processes dietary fats, triglycerides, and cholesterol. Heart attack and stroke are both medical emergencies. There are many things that you can do to help prevent heart disease  and stroke:  Have your blood pressure checked at least every 1-2 years. High blood pressure causes heart disease and increases the risk of stroke.  If you are 53-22 years old, ask your health care provider if you should take aspirin to prevent a heart attack or a stroke.  Do not use any tobacco products, including cigarettes, chewing tobacco, or electronic cigarettes. If you need help quitting, ask your health care provider.  It is important to eat a healthy diet and maintain a healthy weight. ? Be sure to include plenty of vegetables, fruits, low-fat dairy products, and lean protein. ? Avoid eating foods that are high in solid fats, added sugars, or salt (sodium).  Get regular exercise. This is one of the most important things that you can do for your health. ? Try to exercise for at least 150 minutes each week. The type of exercise that you do should increase your heart rate and make you sweat. This is known as moderate-intensity exercise. ? Try to do strengthening exercises at least twice each week. Do these in addition to the moderate-intensity exercise.  Know your numbers.Ask your health care provider to check your cholesterol and your blood glucose. Continue to have your blood tested as directed by your health care provider.  What should I know about cancer screening? There are several types of cancer. Take the following steps to reduce your risk and to catch any cancer development as early as possible. Breast Cancer  Practice breast self-awareness. ? This means understanding how your breasts normally appear and feel. ? It also means doing regular breast self-exams. Let your health care provider know about any changes, no matter how small.  If you are 40  or older, have a clinician do a breast exam (clinical breast exam or CBE) every year. Depending on your age, family history, and medical history, it may be recommended that you also have a yearly breast X-ray (mammogram).  If you  have a family history of breast cancer, talk with your health care provider about genetic screening.  If you are at high risk for breast cancer, talk with your health care provider about having an MRI and a mammogram every year.  Breast cancer (BRCA) gene test is recommended for women who have family members with BRCA-related cancers. Results of the assessment will determine the need for genetic counseling and BRCA1 and for BRCA2 testing. BRCA-related cancers include these types: ? Breast. This occurs in males or females. ? Ovarian. ? Tubal. This may also be called fallopian tube cancer. ? Cancer of the abdominal or pelvic lining (peritoneal cancer). ? Prostate. ? Pancreatic.  Cervical, Uterine, and Ovarian Cancer Your health care provider may recommend that you be screened regularly for cancer of the pelvic organs. These include your ovaries, uterus, and vagina. This screening involves a pelvic exam, which includes checking for microscopic changes to the surface of your cervix (Pap test).  For women ages 21-65, health care providers may recommend a pelvic exam and a Pap test every three years. For women ages 79-65, they may recommend the Pap test and pelvic exam, combined with testing for human papilloma virus (HPV), every five years. Some types of HPV increase your risk of cervical cancer. Testing for HPV may also be done on women of any age who have unclear Pap test results.  Other health care providers may not recommend any screening for nonpregnant women who are considered low risk for pelvic cancer and have no symptoms. Ask your health care provider if a screening pelvic exam is right for you.  If you have had past treatment for cervical cancer or a condition that could lead to cancer, you need Pap tests and screening for cancer for at least 20 years after your treatment. If Pap tests have been discontinued for you, your risk factors (such as having a new sexual partner) need to be  reassessed to determine if you should start having screenings again. Some women have medical problems that increase the chance of getting cervical cancer. In these cases, your health care provider may recommend that you have screening and Pap tests more often.  If you have a family history of uterine cancer or ovarian cancer, talk with your health care provider about genetic screening.  If you have vaginal bleeding after reaching menopause, tell your health care provider.  There are currently no reliable tests available to screen for ovarian cancer.  Lung Cancer Lung cancer screening is recommended for adults 69-62 years old who are at high risk for lung cancer because of a history of smoking. A yearly low-dose CT scan of the lungs is recommended if you:  Currently smoke.  Have a history of at least 30 pack-years of smoking and you currently smoke or have quit within the past 15 years. A pack-year is smoking an average of one pack of cigarettes per day for one year.  Yearly screening should:  Continue until it has been 15 years since you quit.  Stop if you develop a health problem that would prevent you from having lung cancer treatment.  Colorectal Cancer  This type of cancer can be detected and can often be prevented.  Routine colorectal cancer screening usually begins at  age 42 and continues through age 45.  If you have risk factors for colon cancer, your health care provider may recommend that you be screened at an earlier age.  If you have a family history of colorectal cancer, talk with your health care provider about genetic screening.  Your health care provider may also recommend using home test kits to check for hidden blood in your stool.  A small camera at the end of a tube can be used to examine your colon directly (sigmoidoscopy or colonoscopy). This is done to check for the earliest forms of colorectal cancer.  Direct examination of the colon should be repeated every  5-10 years until age 71. However, if early forms of precancerous polyps or small growths are found or if you have a family history or genetic risk for colorectal cancer, you may need to be screened more often.  Skin Cancer  Check your skin from head to toe regularly.  Monitor any moles. Be sure to tell your health care provider: ? About any new moles or changes in moles, especially if there is a change in a mole's shape or color. ? If you have a mole that is larger than the size of a pencil eraser.  If any of your family members has a history of skin cancer, especially at a young age, talk with your health care provider about genetic screening.  Always use sunscreen. Apply sunscreen liberally and repeatedly throughout the day.  Whenever you are outside, protect yourself by wearing long sleeves, pants, a wide-brimmed hat, and sunglasses.  What should I know about osteoporosis? Osteoporosis is a condition in which bone destruction happens more quickly than new bone creation. After menopause, you may be at an increased risk for osteoporosis. To help prevent osteoporosis or the bone fractures that can happen because of osteoporosis, the following is recommended:  If you are 46-71 years old, get at least 1,000 mg of calcium and at least 600 mg of vitamin D per day.  If you are older than age 55 but younger than age 65, get at least 1,200 mg of calcium and at least 600 mg of vitamin D per day.  If you are older than age 54, get at least 1,200 mg of calcium and at least 800 mg of vitamin D per day.  Smoking and excessive alcohol intake increase the risk of osteoporosis. Eat foods that are rich in calcium and vitamin D, and do weight-bearing exercises several times each week as directed by your health care provider. What should I know about how menopause affects my mental health? Depression may occur at any age, but it is more common as you become older. Common symptoms of depression  include:  Low or sad mood.  Changes in sleep patterns.  Changes in appetite or eating patterns.  Feeling an overall lack of motivation or enjoyment of activities that you previously enjoyed.  Frequent crying spells.  Talk with your health care provider if you think that you are experiencing depression. What should I know about immunizations? It is important that you get and maintain your immunizations. These include:  Tetanus, diphtheria, and pertussis (Tdap) booster vaccine.  Influenza every year before the flu season begins.  Pneumonia vaccine.  Shingles vaccine.  Your health care provider may also recommend other immunizations. This information is not intended to replace advice given to you by your health care provider. Make sure you discuss any questions you have with your health care provider. Document Released: 02/25/2005  Document Revised: 07/24/2015 Document Reviewed: 10/07/2014 Elsevier Interactive Patient Education  [employer].

## 2017-11-06 ENCOUNTER — Other Ambulatory Visit: Payer: Self-pay | Admitting: Physician Assistant

## 2017-11-06 DIAGNOSIS — E282 Polycystic ovarian syndrome: Secondary | ICD-10-CM

## 2017-11-08 LAB — CYTOLOGY - PAP
Diagnosis: NEGATIVE
HPV: NOT DETECTED

## 2017-12-01 ENCOUNTER — Ambulatory Visit
Admission: RE | Admit: 2017-12-01 | Discharge: 2017-12-01 | Disposition: A | Discharge status: Home / Self Care | Payer: 59 | Source: Ambulatory Visit | Attending: Physician Assistant | Admitting: Physician Assistant

## 2017-12-01 DIAGNOSIS — Z1239 Encounter for other screening for malignant neoplasm of breast: Secondary | ICD-10-CM | POA: Diagnosis not present

## 2017-12-01 DIAGNOSIS — Z1231 Encounter for screening mammogram for malignant neoplasm of breast: Secondary | ICD-10-CM | POA: Diagnosis not present

## 2017-12-11 ENCOUNTER — Telehealth: Payer: Self-pay | Admitting: Physician Assistant

## 2017-12-11 DIAGNOSIS — E039 Hypothyroidism, unspecified: Secondary | ICD-10-CM

## 2017-12-11 MED ORDER — LEVOTHYROXINE SODIUM 125 MCG PO TABS: 125.0000 ug | ORAL_TABLET | Freq: Every day | ORAL | 3 refills | Status: DC

## 2017-12-11 NOTE — Telephone Encounter (Signed)
Received labs that showed overcorrected thyroid. Please notify patient dose should be decreased to if agreeable. Should recheck in 6-8 weeks after changing dose.

## 2017-12-11 NOTE — Telephone Encounter (Signed)
Patient advised as directed below. Per patient the nurse at there office usually does her labs.

## 2018-01-11 ENCOUNTER — Other Ambulatory Visit: Payer: Self-pay | Admitting: Physician Assistant

## 2018-01-11 DIAGNOSIS — E282 Polycystic ovarian syndrome: Secondary | ICD-10-CM

## 2018-01-11 DIAGNOSIS — L680 Hirsutism: Secondary | ICD-10-CM

## 2018-03-05 ENCOUNTER — Other Ambulatory Visit: Payer: Self-pay | Admitting: Physician Assistant

## 2018-03-05 DIAGNOSIS — E282 Polycystic ovarian syndrome: Secondary | ICD-10-CM

## 2018-03-05 DIAGNOSIS — L680 Hirsutism: Secondary | ICD-10-CM

## 2018-03-23 ENCOUNTER — Other Ambulatory Visit: Payer: Self-pay

## 2018-03-24 LAB — CMP12+LP+TP+TSH+6AC+CBC/D/PLT
A/G RATIO: 1.9 (ref 1.2–2.2)
ALBUMIN: 4.8 g/dL (ref 3.8–4.9)
ALT: 16 IU/L (ref 0–32)
AST: 17 IU/L (ref 0–40)
Alkaline Phosphatase: 68 IU/L (ref 39–117)
BUN / CREAT RATIO: 14 (ref 9–23)
BUN: 15 mg/dL (ref 6–24)
Basophils Absolute: 0 10*3/uL (ref 0.0–0.2)
Basos: 1 %
Bilirubin Total: 0.3 mg/dL (ref 0.0–1.2)
CALCIUM: 9.8 mg/dL (ref 8.7–10.2)
CHOL/HDL RATIO: 6.9 ratio — AB (ref 0.0–4.4)
CHOLESTEROL TOTAL: 201 mg/dL — AB (ref 100–199)
Chloride: 104 mmol/L (ref 96–106)
Creatinine, Ser: 1.08 mg/dL — ABNORMAL HIGH (ref 0.57–1.00)
EOS (ABSOLUTE): 0.2 10*3/uL (ref 0.0–0.4)
Eos: 2 %
Estimated CHD Risk: 2 times avg. — ABNORMAL HIGH (ref 0.0–1.0)
FREE THYROXINE INDEX: 3.5 (ref 1.2–4.9)
GFR calc Af Amer: 66 mL/min/{1.73_m2} (ref >59)
GFR, EST NON AFRICAN AMERICAN: 58 mL/min/{1.73_m2} — AB (ref >59)
GGT: 17 IU/L (ref 0–60)
GLOBULIN, TOTAL: 2.5 g/dL (ref 1.5–4.5)
Glucose: 94 mg/dL (ref 65–99)
HDL: 29 mg/dL — ABNORMAL LOW (ref >39)
Hematocrit: 42.2 % (ref 34.0–46.6)
Hemoglobin: 14.5 g/dL (ref 11.1–15.9)
IRON: 73 ug/dL (ref 27–159)
Immature Grans (Abs): 0 10*3/uL (ref 0.0–0.1)
Immature Granulocytes: 0 %
LDH: 175 IU/L (ref 119–226)
LDL Calculated: 108 mg/dL — ABNORMAL HIGH (ref 0–99)
LYMPHS: 36 %
Lymphocytes Absolute: 2.6 10*3/uL (ref 0.7–3.1)
MCH: 30.3 pg (ref 26.6–33.0)
MCHC: 34.4 g/dL (ref 31.5–35.7)
MCV: 88 fL (ref 79–97)
MONOS ABS: 0.5 10*3/uL (ref 0.1–0.9)
Monocytes: 6 %
NEUTROS ABS: 4 10*3/uL (ref 1.4–7.0)
Neutrophils: 55 %
PHOSPHORUS: 3.9 mg/dL (ref 3.0–4.3)
PLATELETS: 346 10*3/uL (ref 150–450)
Potassium: 4.9 mmol/L (ref 3.5–5.2)
RBC: 4.79 x10E6/uL (ref 3.77–5.28)
RDW: 13 % (ref 11.7–15.4)
Sodium: 142 mmol/L (ref 134–144)
T3 UPTAKE RATIO: 26 % (ref 24–39)
T4 TOTAL: 13.3 ug/dL — AB (ref 4.5–12.0)
TOTAL PROTEIN: 7.3 g/dL (ref 6.0–8.5)
TRIGLYCERIDES: 322 mg/dL — AB (ref 0–149)
TSH: 0.195 u[IU]/mL — AB (ref 0.450–4.500)
Uric Acid: 5.9 mg/dL (ref 2.5–7.1)
VLDL Cholesterol Cal: 64 mg/dL — ABNORMAL HIGH (ref 5–40)
WBC: 7.2 10*3/uL (ref 3.4–10.8)

## 2018-05-17 ENCOUNTER — Other Ambulatory Visit: Payer: Self-pay | Admitting: Physician Assistant

## 2018-05-17 DIAGNOSIS — E782 Mixed hyperlipidemia: Secondary | ICD-10-CM

## 2018-05-17 MED FILL — LEVOTHYROXINE 125 MCG TABLE: 125 | 90 days supply | Qty: 90 | Fill #0

## 2018-05-17 MED FILL — SPIRONOLACTONE 50 MG TABS: 50 | 90 days supply | Qty: 90 | Fill #0

## 2018-05-17 MED FILL — metFORMIN HCL ER 500 MG TB2: 500 | 90 days supply | Qty: 180 | Fill #0

## 2018-05-18 MED FILL — GEMFIBROZIL 600 MG TABS: 600 | 90 days supply | Qty: 180 | Fill #0

## 2018-08-09 MED FILL — METFORMIN HCL ER 500 MG TB2: 500 | 30 days supply | Qty: 60 | Fill #1

## 2018-08-09 MED FILL — SPIRONOLACTONE 50 MG TABS: 50 | 90 days supply | Qty: 90 | Fill #1

## 2018-08-09 MED FILL — LEVOTHYROXINE 125 MCG TABLE: 125 | 90 days supply | Qty: 90 | Fill #1

## 2018-08-10 MED FILL — GEMFIBROZIL 600 MG TABS: 600 | 90 days supply | Qty: 180 | Fill #1

## 2018-09-08 MED FILL — metFORMIN HCL ER 500 MG TB2: 500 | 30 days supply | Qty: 60 | Fill #2

## 2018-10-08 MED FILL — metFORMIN HCL ER 500 MG TB2: 500 | 30 days supply | Qty: 60 | Fill #3

## 2018-11-07 ENCOUNTER — Other Ambulatory Visit: Payer: Self-pay | Admitting: Physician Assistant

## 2018-11-07 DIAGNOSIS — E039 Hypothyroidism, unspecified: Secondary | ICD-10-CM

## 2018-11-07 DIAGNOSIS — E282 Polycystic ovarian syndrome: Secondary | ICD-10-CM

## 2018-11-07 MED ORDER — METFORMIN HCL ER 500 MG PO TB24: ORAL_TABLET | ORAL | 3 refills | Status: DC

## 2018-11-07 MED ORDER — LEVOTHYROXINE SODIUM 125 MCG PO TABS: 125.0000 ug | ORAL_TABLET | Freq: Every day | ORAL | 3 refills | Status: DC

## 2018-11-07 MED FILL — SPIRONOLACTONE 50 MG TABS: 50 | 90 days supply | Qty: 90 | Fill #2

## 2018-11-07 MED FILL — LEVOTHYROXINE 125 MCG TABLE: 125 | 90 days supply | Qty: 90 | Fill #0

## 2018-11-07 NOTE — Telephone Encounter (Signed)
[facility] faxed refill request for the following medications:  metFORMIN (GLUCOPHAGE-XR) 500 MG 24 hr tablet  levothyroxine (SYNTHROID, LEVOTHROID) 125 MCG tablet    Please advise.

## 2018-11-08 MED FILL — GEMFIBROZIL 600 MG TABLET: 600 | 90 days supply | Qty: 180 | Fill #2

## 2018-11-08 MED FILL — metFORMIN HCL ER 500 MG TB2: 500 | 90 days supply | Qty: 180 | Fill #0

## 2018-11-08 NOTE — Progress Notes (Signed)
Patient: Gail Hunter, Female    DOB: Jun 21, 1960, 58 y.o.   MRN: [id number] Visit Date: 11/09/2018  Today's Provider: Margaretann Loveless, PA-C   Chief Complaint  Patient presents with  . Annual Exam   Subjective:     Annual physical exam Gail Hunter is a 58 y.o. female who presents today for health maintenance and complete physical. She feels well. She reports exercising. She reports she is sleeping well. ----------------------------------------------------------------- 12/04/2017 at 8:01 -Normal mammogram. Repeat screening in one year. 11/08/2017 -Normal Pap smear. HPV negative. Repeat in 3 to 5 years  03/31/16-Colonoscopy-recheck in 10 years  Review of Systems  Constitutional: Negative.   HENT: Negative.   Eyes: Negative.   Respiratory: Negative.   Cardiovascular: Negative.   Gastrointestinal: Negative.   Endocrine: Negative.   Genitourinary: Negative.   Musculoskeletal: Negative.   Skin: Negative.   Allergic/Immunologic: Positive for environmental allergies.  Neurological: Negative.   Hematological: Negative.   Psychiatric/Behavioral: Negative.     Social History      She  reports that she quit smoking about 18 years ago. Her smoking use included cigarettes. She has a 0.50 pack-year smoking history. She has never used smokeless tobacco. She reports that she does not drink alcohol or use drugs.       Social History   Socioeconomic History  . Marital status: Married    Spouse name: Not on file  . Number of children: 1  . Years of education: Not on file  . Highest education level: Not on file  Occupational History  . Not on file  Social Needs  . Financial resource strain: Not on file  . Food insecurity    Worry: Not on file    Inability: Not on file  . Transportation needs    Medical: Not on file    Non-medical: Not on file  Tobacco Use  . Smoking status: Former Smoker    Packs/day: 0.25    Years: 2.00    Pack years: 0.50    Types:  Cigarettes    Quit date: 01/17/2000    Years since quitting: 18.8  . Smokeless tobacco: Never Used  Substance and Sexual Activity  . Alcohol use: No  . Drug use: No  . Sexual activity: Not on file  Lifestyle  . Physical activity    Days per week: Not on file    Minutes per session: Not on file  . Stress: Not on file  Relationships  . Social Musician on phone: Not on file    Gets together: Not on file    Attends religious service: Not on file    Active member of club or organization: Not on file    Attends meetings of clubs or organizations: Not on file    Relationship status: Not on file  Other Topics Concern  . Not on file  Social History Narrative  . Not on file    Past Medical History:  Diagnosis Date  . Allergy   . Anemia   . History of chicken pox   . History of measles   . Thyroid disease      Patient Active Problem List   Diagnosis Date Noted  . PCOS (polycystic ovarian syndrome) 09/05/2014  . Hypercholesterolemia with hypertriglyceridemia 09/05/2014  . Hirsutism 09/05/2014  . Hypothyroidism 09/05/2014  . Allergic rhinitis 09/05/2014  . Low HDL (under 40) 09/05/2014    Past Surgical History:  Procedure Laterality Date  .  CESAREAN SECTION    . CHOLECYSTECTOMY  02/2002  . LAPAROSCOPIC ABDOMINAL EXPLORATION      Family History        Family Status  Relation Name Status  . Mother  Alive  . Father  Deceased       died 01/2016  . Sister  Alive  . Brother  Alive  . Daughter  Alive        Her family history includes Alzheimer's disease in her father and mother; Diabetes in her sister; Hyperlipidemia in her brother; Thyroid disease in her mother.      No Known Allergies   Current Outpatient Medications:  .  Alpha-Lipoic Acid 600 MG CAPS, Take by mouth., Disp: , Rfl:  .  B Complex Vitamins (B COMPLEX PO), Take by mouth., Disp: , Rfl:  .  cetirizine (ZYRTEC) 10 MG tablet, Take 10 mg by mouth daily., Disp: , Rfl:  .  Cholecalciferol  (VITAMIN D3) 5000 units TABS, Take by mouth., Disp: , Rfl:  .  fluticasone (FLONASE) 50 MCG/ACT nasal spray, Place into both nostrils daily., Disp: , Rfl:  .  gemfibrozil (LOPID) 600 MG tablet, TAKE 1 TABLET BY MOUTH 2 TIMES DAILY BEFORE A MEAL., Disp: 180 tablet, Rfl: 3 .  levothyroxine (SYNTHROID) 125 MCG tablet, Take 1 tablet (125 mcg total) by mouth daily., Disp: 90 tablet, Rfl: 3 .  Multiple Vitamin (MULTIVITAMINS PO), Take by mouth., Disp: , Rfl:  .  Omega-3 Fatty Acids (FISH OIL) 1200 MG CAPS, Take by mouth., Disp: , Rfl:  .  spironolactone (ALDACTONE) 50 MG tablet, TAKE 1 TABLET BY MOUTH DAILY, Disp: 90 tablet, Rfl: 4 .  metFORMIN (GLUCOPHAGE-XR) 500 MG 24 hr tablet, TAKE 2 TABLETS BY MOUTH DAILY WITH BREAKFAST. (Patient not taking: Reported on 11/09/2018), Disp: 180 tablet, Rfl: 3   Patient Care Team: Margaretann LovelessBurnette, Jennifer M, PA-C as PCP - General (Physician Assistant)    Objective:    Vitals: BP 128/74 (BP Location: Left Arm, Patient Position: Sitting, Cuff Size: Large)   Pulse 77   Temp (!) 97.5 F (36.4 C) (Other (Comment))   Resp 16   Ht 5' (1.524 m)   Wt 169 lb 8 oz (76.9 kg)   LMP 03/17/2016   BMI 33.10 kg/m    Vitals:   11/09/18 1403  BP: 128/74  Pulse: 77  Resp: 16  Temp: (!) 97.5 F (36.4 C)  TempSrc: Other (Comment)  Weight: 169 lb 8 oz (76.9 kg)  Height: 5' (1.524 m)     Physical Exam Vitals signs reviewed.  Constitutional:      General: She is not in acute distress.    Appearance: Normal appearance. She is well-developed. She is obese. She is not ill-appearing or diaphoretic.  HENT:     Head: Normocephalic and atraumatic.     Right Ear: Tympanic membrane, ear canal and external ear normal.     Left Ear: Tympanic membrane, ear canal and external ear normal.     Nose: Nose normal.     Mouth/Throat:     Mouth: Mucous membranes are moist.     Pharynx: Oropharynx is clear. No oropharyngeal exudate.  Eyes:     General: No scleral icterus.       Right  eye: No discharge.        Left eye: No discharge.     Extraocular Movements: Extraocular movements intact.     Conjunctiva/sclera: Conjunctivae normal.     Pupils: Pupils are equal, round, and reactive to  light.  Neck:     Musculoskeletal: Normal range of motion and neck supple.     Thyroid: No thyromegaly.     Vascular: No carotid bruit or JVD.     Trachea: No tracheal deviation.  Cardiovascular:     Rate and Rhythm: Normal rate and regular rhythm.     Pulses: Normal pulses.     Heart sounds: Normal heart sounds. No murmur. No friction rub. No gallop.   Pulmonary:     Effort: Pulmonary effort is normal. No respiratory distress.     Breath sounds: Normal breath sounds. No wheezing or rales.  Chest:     Chest wall: No tenderness.  Abdominal:     General: Abdomen is flat. Bowel sounds are normal. There is no distension.     Palpations: Abdomen is soft. There is no mass.     Tenderness: There is no abdominal tenderness. There is no guarding or rebound.  Musculoskeletal: Normal range of motion.        General: No tenderness.     Right lower leg: No edema.     Left lower leg: No edema.  Lymphadenopathy:     Cervical: No cervical adenopathy.  Skin:    General: Skin is warm and dry.     Capillary Refill: Capillary refill takes less than 2 seconds.     Findings: No rash.  Neurological:     General: No focal deficit present.     Mental Status: She is alert and oriented to person, place, and time. Mental status is at baseline.  Psychiatric:        Mood and Affect: Mood normal.        Behavior: Behavior normal.        Thought Content: Thought content normal.        Judgment: Judgment normal.      Depression Screen PHQ 2/9 Scores 11/09/2018 11/03/2017 10/21/2016 10/16/2015  PHQ - 2 Score 1 0 0 0       Assessment & Plan:     Routine Health Maintenance and Physical Exam  Exercise Activities and Dietary recommendations Goals   None     Immunization History  Administered  Date(s) Administered  . Influenza-Unspecified 10/11/2016, 10/10/2017, 10/25/2018  . Td 09/13/2016  . Tdap 09/13/2016    Health Maintenance  Topic Date Due  . Hepatitis C Screening  [birth year]  . HIV Screening  11/21/1976  . MAMMOGRAM  12/02/2019  . PAP SMEAR-Modifier  11/03/2020  . COLONOSCOPY  04/01/2026  . TETANUS/TDAP  09/14/2026  . INFLUENZA VACCINE  Completed     Discussed health benefits of physical activity, and encouraged her to engage in regular exercise appropriate for her age and condition.    1. Annual physical exam Normal physical exam today. Will check labs as below and f/u pending lab results. If labs are stable and WNL she will not need to have these rechecked for one year at her next annual physical exam. She is to call the office in the meantime if she has any acute issue, questions or concerns. - CBC with Differential/Platelet - Comprehensive metabolic panel - Hemoglobin A1c - Lipid panel - TSH  2. Encounter for breast cancer screening using non-mammogram modality Breast exam today was normal. There is no family history of breast cancer. She does perform regular self breast exams. Mammogram was ordered as below. Information for [facility] clinic was given to patient so she may schedule her mammogram at her convenience. - MM 3D SCREEN BREAST  BILATERAL; Future  3. Hypothyroidism due to acquired atrophy of thyroid Stable. Continue Levothyroxine 119mcg. Will check labs as below and f/u pending results. - TSH  4. Hypercholesterolemia with hypertriglyceridemia Stable. Continue Gemfibrozil 600mg . Will check labs as below and f/u pending results. - Lipid panel  5. Class 1 obesity due to excess calories with serious comorbidity and body mass index (BMI) of 33.0 to 33.9 in adult Counseled patient on healthy lifestyle modifications including dieting and exercise.   --------------------------------------------------------------------    Mar Daring,  PA-C  [facility]

## 2018-11-09 ENCOUNTER — Encounter: Payer: Self-pay | Admitting: Physician Assistant

## 2018-11-09 ENCOUNTER — Telehealth: Payer: Self-pay | Admitting: Physician Assistant

## 2018-11-09 ENCOUNTER — Ambulatory Visit (INDEPENDENT_AMBULATORY_CARE_PROVIDER_SITE_OTHER): Payer: 59 | Admitting: Physician Assistant

## 2018-11-09 ENCOUNTER — Other Ambulatory Visit: Payer: Self-pay

## 2018-11-09 VITALS — BP 128/74 | HR 77 | Temp 97.5°F | Resp 16 | Ht 60.0 in | Wt 169.5 lb

## 2018-11-09 DIAGNOSIS — Z1239 Encounter for other screening for malignant neoplasm of breast: Secondary | ICD-10-CM

## 2018-11-09 DIAGNOSIS — E6609 Other obesity due to excess calories: Secondary | ICD-10-CM | POA: Diagnosis not present

## 2018-11-09 DIAGNOSIS — Z6833 Body mass index (BMI) 33.0-33.9, adult: Secondary | ICD-10-CM | POA: Diagnosis not present

## 2018-11-09 DIAGNOSIS — E782 Mixed hyperlipidemia: Secondary | ICD-10-CM | POA: Diagnosis not present

## 2018-11-09 DIAGNOSIS — Z0000 Encounter for general adult medical examination without abnormal findings: Secondary | ICD-10-CM

## 2018-11-09 DIAGNOSIS — E034 Atrophy of thyroid (acquired): Secondary | ICD-10-CM | POA: Diagnosis not present

## 2018-11-09 NOTE — Patient Instructions (Signed)

## 2018-11-09 NOTE — Telephone Encounter (Signed)
[facility] faxed refill request for the following medications  metFORMIN (GLUCOPHAGE-XR) 500 MG 24 hr tablet Quantity: 180 Refills: 2   Please advise.  Thanks, [employer]

## 2018-11-09 NOTE — Telephone Encounter (Signed)
Patient reported not taking the Metformin

## 2018-11-10 LAB — CBC WITH DIFFERENTIAL/PLATELET
Basophils Absolute: 0 10*3/uL (ref 0.0–0.2)
Basos: 0 %
EOS (ABSOLUTE): 0.2 10*3/uL (ref 0.0–0.4)
Eos: 2 %
Hematocrit: 39.2 % (ref 34.0–46.6)
Hemoglobin: 13.7 g/dL (ref 11.1–15.9)
Immature Grans (Abs): 0 10*3/uL (ref 0.0–0.1)
Immature Granulocytes: 0 %
Lymphocytes Absolute: 2.7 10*3/uL (ref 0.7–3.1)
Lymphs: 40 %
MCH: 30.7 pg (ref 26.6–33.0)
MCHC: 34.9 g/dL (ref 31.5–35.7)
MCV: 88 fL (ref 79–97)
Monocytes Absolute: 0.5 10*3/uL (ref 0.1–0.9)
Monocytes: 7 %
Neutrophils Absolute: 3.4 10*3/uL (ref 1.4–7.0)
Neutrophils: 51 %
Platelets: 317 10*3/uL (ref 150–450)
RBC: 4.46 x10E6/uL (ref 3.77–5.28)
RDW: 12.8 % (ref 11.7–15.4)
WBC: 6.8 10*3/uL (ref 3.4–10.8)

## 2018-11-10 LAB — COMPREHENSIVE METABOLIC PANEL
ALT: 17 IU/L (ref 0–32)
AST: 22 IU/L (ref 0–40)
Albumin/Globulin Ratio: 2 (ref 1.2–2.2)
Albumin: 4.7 g/dL (ref 3.8–4.9)
Alkaline Phosphatase: 60 IU/L (ref 39–117)
BUN/Creatinine Ratio: 16 (ref 9–23)
BUN: 15 mg/dL (ref 6–24)
Bilirubin Total: 0.6 mg/dL (ref 0.0–1.2)
CO2: 22 mmol/L (ref 20–29)
Calcium: 9.7 mg/dL (ref 8.7–10.2)
Chloride: 102 mmol/L (ref 96–106)
Creatinine, Ser: 0.95 mg/dL (ref 0.57–1.00)
GFR calc Af Amer: 77 mL/min/{1.73_m2} (ref >59)
GFR calc non Af Amer: 67 mL/min/{1.73_m2} (ref >59)
Globulin, Total: 2.3 g/dL (ref 1.5–4.5)
Glucose: 80 mg/dL (ref 65–99)
Potassium: 4.3 mmol/L (ref 3.5–5.2)
Sodium: 140 mmol/L (ref 134–144)
Total Protein: 7 g/dL (ref 6.0–8.5)

## 2018-11-10 LAB — LIPID PANEL
Chol/HDL Ratio: 6 ratio — ABNORMAL HIGH (ref 0.0–4.4)
Cholesterol, Total: 193 mg/dL (ref 100–199)
HDL: 32 mg/dL — ABNORMAL LOW (ref >39)
LDL Chol Calc (NIH): 128 mg/dL — ABNORMAL HIGH (ref 0–99)
Triglycerides: 185 mg/dL — ABNORMAL HIGH (ref 0–149)
VLDL Cholesterol Cal: 33 mg/dL (ref 5–40)

## 2018-11-10 LAB — HEMOGLOBIN A1C
Est. average glucose Bld gHb Est-mCnc: 105 mg/dL
Hgb A1c MFr Bld: 5.3 % (ref 4.8–5.6)

## 2018-11-10 LAB — TSH: TSH: 0.163 u[IU]/mL — ABNORMAL LOW (ref 0.450–4.500)

## 2018-11-12 ENCOUNTER — Encounter: Payer: Self-pay | Admitting: Physician Assistant

## 2018-11-12 NOTE — Telephone Encounter (Signed)
Sent patient a mychart to see if she wants this

## 2019-01-30 MED FILL — LEVOTHYROXINE 125 MCG TABLE: 125 | 90 days supply | Qty: 90 | Fill #1

## 2019-01-31 MED FILL — metFORMIN HCL ER 500 MG TB2: 500 | 90 days supply | Qty: 180 | Fill #1

## 2019-02-05 ENCOUNTER — Other Ambulatory Visit: Payer: Self-pay | Admitting: Physician Assistant

## 2019-02-05 DIAGNOSIS — E282 Polycystic ovarian syndrome: Secondary | ICD-10-CM

## 2019-02-05 DIAGNOSIS — L680 Hirsutism: Secondary | ICD-10-CM

## 2019-02-05 MED ORDER — SPIRONOLACTONE 50 MG PO TABS: 50.0000 mg | ORAL_TABLET | Freq: Every day | ORAL | 4 refills | Status: DC

## 2019-02-05 MED FILL — SPIRONOLACTONE 50 MG TABS: 50 | 90 days supply | Qty: 90 | Fill #0

## 2019-02-05 NOTE — Telephone Encounter (Signed)
[facility] faxed refill request for the following medications:  spironolactone (ALDACTONE) 50 MG tablet   Please advise.

## 2019-02-06 MED FILL — GEMFIBROZIL 600 MG TABLET: 600 | 90 days supply | Qty: 180 | Fill #3

## 2019-02-07 ENCOUNTER — Other Ambulatory Visit: Payer: Self-pay

## 2019-02-07 DIAGNOSIS — L680 Hirsutism: Secondary | ICD-10-CM

## 2019-02-07 DIAGNOSIS — E282 Polycystic ovarian syndrome: Secondary | ICD-10-CM

## 2019-02-07 MED ORDER — SPIRONOLACTONE 50 MG PO TABS: 50.0000 mg | ORAL_TABLET | Freq: Every day | ORAL | 4 refills | Status: DC

## 2019-04-30 MED FILL — LEVOTHYROXINE 125 MCG TABLE: 125 | 90 days supply | Qty: 90 | Fill #2

## 2019-04-30 MED FILL — SPIRONOLACTONE 50 MG TABLET: 50 | 90 days supply | Qty: 90 | Fill #0

## 2019-05-01 MED FILL — metFORMIN HCL ER 500 MG TB2: 500 | 90 days supply | Qty: 180 | Fill #2

## 2019-05-07 ENCOUNTER — Other Ambulatory Visit: Payer: Self-pay | Admitting: Physician Assistant

## 2019-05-07 DIAGNOSIS — E782 Mixed hyperlipidemia: Secondary | ICD-10-CM

## 2019-05-07 MED FILL — GEMFIBROZIL 600 MG TABLET: 600 | 90 days supply | Qty: 180 | Fill #0

## 2019-11-15 ENCOUNTER — Encounter: Payer: Self-pay | Admitting: Physician Assistant

## 2019-11-20 NOTE — Progress Notes (Signed)
Complete physical exam   Patient: Gail Hunter   DOB: [birth year]   59 y.o. Female  MRN: [id number] Visit Date: 11/22/2019  Today's healthcare provider: Margaretann Loveless, PA-C   Chief Complaint  Patient presents with  . Annual Exam   Subjective    Gail Hunter is a 59 y.o. female who presents today for a complete physical exam.  She reports consuming a general diet. The patient does not participate in regular exercise at present. She generally feels well. She reports sleeping well. She does not have additional problems to discuss today.  HPI  11/03/17-Normal Pap smear. HPV negative. Repeat in 3 to 5 years 11/25/19-Mammogram Scheduled. 03/31/16-Colonoscopy,Dr. Mechele Collin, recheck in 10 years  Past Medical History:  Diagnosis Date  . Allergy   . Anemia   . History of chicken pox   . History of measles   . Thyroid disease    Past Surgical History:  Procedure Laterality Date  . CESAREAN SECTION    . CHOLECYSTECTOMY  02/2002  . LAPAROSCOPIC ABDOMINAL EXPLORATION     Social History   Socioeconomic History  . Marital status: Married    Spouse name: Not on file  . Number of children: 1  . Years of education: Not on file  . Highest education level: Not on file  Occupational History  . Not on file  Tobacco Use  . Smoking status: Former Smoker    Packs/day: 0.25    Years: 2.00    Pack years: 0.50    Types: Cigarettes    Quit date: 01/17/2000    Years since quitting: 19.8  . Smokeless tobacco: Never Used  Substance and Sexual Activity  . Alcohol use: No  . Drug use: No  . Sexual activity: Not on file  Other Topics Concern  . Not on file  Social History Narrative  . Not on file   Social Determinants of Health   Financial Resource Strain:   . Difficulty of Paying Living Expenses: Not on file  Food Insecurity:   . Worried About Programme researcher, broadcasting/film/video in the Last Year: Not on file  . Ran Out of Food in the Last Year: Not on file  Transportation Needs:   .  Lack of Transportation (Medical): Not on file  . Lack of Transportation (Non-Medical): Not on file  Physical Activity:   . Days of Exercise per Week: Not on file  . Minutes of Exercise per Session: Not on file  Stress:   . Feeling of Stress : Not on file  Social Connections:   . Frequency of Communication with Friends and Family: Not on file  . Frequency of Social Gatherings with Friends and Family: Not on file  . Attends Religious Services: Not on file  . Active Member of Clubs or Organizations: Not on file  . Attends Banker Meetings: Not on file  . Marital Status: Not on file  Intimate Partner Violence:   . Fear of Current or Ex-Partner: Not on file  . Emotionally Abused: Not on file  . Physically Abused: Not on file  . Sexually Abused: Not on file   Family Status  Relation Name Status  . Mother  Alive  . Father  Deceased       died 25-Feb-2016  . Sister  Alive  . Brother  Alive  . Daughter  Alive   Family History  Problem Relation Age of Onset  . Alzheimer's disease Mother   . Thyroid disease  Mother   . Alzheimer's disease Father   . Diabetes Sister   . Hyperlipidemia Brother    No Known Allergies  Patient Care Team: Reine Just as PCP - General (Physician Assistant)   Medications: Outpatient Medications Prior to Visit  Medication Sig  . Ascorbic Acid (VITAMIN C PO) Take by mouth. 500mg  tablet  . B Complex Vitamins (B COMPLEX PO) Take by mouth.  . cetirizine (ZYRTEC) 10 MG tablet Take 10 mg by mouth daily.  . Cholecalciferol (VITAMIN D3) 5000 units TABS Take by mouth.  . fluticasone (FLONASE) 50 MCG/ACT nasal spray Place into both nostrils daily.  gemfibrozil (LOPID) 600 MG tablet TAKE 1 TABLET BY MOUTH 2 TIMES DAILY BEFORE A MEAL.  Marland Kitchen levothyroxine (SYNTHROID) 125 MCG tablet Take 1 tablet (125 mcg total) by mouth daily.  . metFORMIN (GLUCOPHAGE-XR) 500 MG 24 hr tablet TAKE 2 TABLETS BY MOUTH DAILY WITH BREAKFAST.  . Multiple Vitamin  (MULTIVITAMINS PO) Take by mouth.  . spironolactone (ALDACTONE) 50 MG tablet Take 1 tablet (50 mg total) by mouth daily.  . Zinc 50 MG TABS Take by mouth.  . Alpha-Lipoic Acid 600 MG CAPS Take by mouth.  . Omega-3 Fatty Acids (FISH OIL) 1200 MG CAPS Take by mouth.   No facility-administered medications prior to visit.    Review of Systems  Constitutional: Positive for appetite change and unexpected weight change.  HENT: Negative.   Eyes: Negative.   Respiratory: Negative.   Cardiovascular: Negative.   Gastrointestinal: Negative.   Endocrine: Negative.   Genitourinary: Negative.   Musculoskeletal: Positive for arthralgias.  Skin: Negative.   Allergic/Immunologic: Negative.   Neurological: Negative.   Hematological: Negative.   Psychiatric/Behavioral: Negative.        Objective    BP 129/83 (BP Location: Left Arm, Patient Position: Sitting, Cuff Size: Large)   Pulse 76   Temp 98.8 F (37.1 C) (Oral)   Resp 16   Wt 184 lb 6.4 oz (83.6 kg)   LMP 03/17/2016   BMI 36.01 kg/m     Physical Exam Vitals reviewed.  Constitutional:      General: She is not in acute distress.    Appearance: Normal appearance. She is well-developed and well-groomed. She is obese. She is not ill-appearing or diaphoretic.  HENT:     Head: Normocephalic and atraumatic.     Right Ear: Tympanic membrane, ear canal and external ear normal.     Left Ear: Tympanic membrane, ear canal and external ear normal.     Nose: Nose normal.  Eyes:     General: No scleral icterus.       Right eye: No discharge.        Left eye: No discharge.     Extraocular Movements: Extraocular movements intact.     Conjunctiva/sclera: Conjunctivae normal.     Pupils: Pupils are equal, round, and reactive to light.  Neck:     Thyroid: No thyromegaly.     Vascular: No carotid bruit or JVD.     Trachea: No tracheal deviation.  Cardiovascular:     Rate and Rhythm: Normal rate and regular rhythm.     Pulses: Normal  pulses.     Heart sounds: Normal heart sounds. No murmur heard.  No friction rub. No gallop.   Pulmonary:     Effort: Pulmonary effort is normal. No respiratory distress.     Breath sounds: Normal breath sounds. No wheezing or rales.  Chest:     Chest wall:  No tenderness.  Abdominal:     General: Abdomen is flat. Bowel sounds are normal. There is no distension.     Palpations: Abdomen is soft. There is no mass.     Tenderness: There is no abdominal tenderness. There is no guarding or rebound.  Musculoskeletal:        General: No tenderness. Normal range of motion.     Cervical back: Normal range of motion and neck supple. No tenderness.     Right lower leg: No edema.     Left lower leg: No edema.  Lymphadenopathy:     Cervical: No cervical adenopathy.  Skin:    General: Skin is warm and dry.     Capillary Refill: Capillary refill takes less than 2 seconds.     Findings: No rash.  Neurological:     General: No focal deficit present.     Mental Status: She is alert and oriented to person, place, and time. Mental status is at baseline.  Psychiatric:        Mood and Affect: Mood normal.        Behavior: Behavior normal. Behavior is cooperative.        Thought Content: Thought content normal.        Judgment: Judgment normal.     Last depression screening scores PHQ 2/9 Scores 11/22/2019 11/09/2018 11/03/2017  PHQ - 2 Score 0 1 0   Last fall risk screening Fall Risk  11/22/2019  Falls in the past year? 0  Number falls in past yr: 0  Injury with Fall? 0  Risk for fall due to : No Fall Risks  Follow up Falls evaluation completed   Last Audit-C alcohol use screening Alcohol Use Disorder Test (AUDIT) 11/22/2019  1. How often do you have a drink containing alcohol? 0  2. How many drinks containing alcohol do you have on a typical day when you are drinking? 0  3. How often do you have six or more drinks on one occasion? 0  AUDIT-C Score 0  Alcohol Brief Interventions/Follow-up  AUDIT Score <7 follow-up not indicated   A score of 3 or more in women, and 4 or more in men indicates increased risk for alcohol abuse, EXCEPT if all of the points are from question 1   No results found for any visits on 11/22/19.  Assessment & Plan    Routine Health Maintenance and Physical Exam  Exercise Activities and Dietary recommendations Goals   None     Immunization History  Administered Date(s) Administered  . Influenza-Unspecified 10/11/2016, 10/10/2017, 10/25/2018  . Td 09/13/2016  . Tdap 09/13/2016    Health Maintenance  Topic Date Due  . Hepatitis C Screening  Never done  . COVID-19 Vaccine (1) Never done  . HIV Screening  Never done  . INFLUENZA VACCINE  08/18/2019  . MAMMOGRAM  12/02/2019  . PAP SMEAR-Modifier  11/03/2020  . COLONOSCOPY  04/01/2026  . TETANUS/TDAP  09/14/2026    Discussed health benefits of physical activity, and encouraged her to engage in regular exercise appropriate for her age and condition.  1. Annual physical exam Normal physical exam today. Will check labs as below and f/u pending lab results. If labs are stable and WNL she will not need to have these rechecked for one year at her next annual physical exam. She is to call the office in the meantime if she has any acute issue, questions or concerns. - CBC with Differential/Platelet - Comprehensive metabolic panel - Hemoglobin  A1c  2. Encounter for breast cancer screening using non-mammogram modality Mammogram scheduled for 11/25/19.  3. Hypothyroidism due to acquired atrophy of thyroid Stable on levothyroxine . Will check labs as below and f/u pending results. - TSH  4. PCOS (polycystic ovarian syndrome) Had been off metformin for a while, but recently restarted. Continue Spironolactone. Will check labs as below and f/u pending results. - Hemoglobin A1c  5. Hypercholesterolemia with hypertriglyceridemia Stable on Gemfibrozil 600mg  BID. Will check labs as below and f/u  pending results. - Lipid panel - TSH  6. Class 2 severe obesity due to excess calories with serious comorbidity and body mass index (BMI) of 36.0 to 36.9 in adult [facility]) Counseled patient on healthy lifestyle modifications including dieting and exercise.    No follow-ups on file.     [facility], PA-C, have reviewed all documentation for this visit. The documentation on 11/22/19 for the exam, diagnosis, procedures, and orders are all accurate and complete.   13/05/21  [facility] [phone] (phone) [phone] (fax)  [facility]

## 2019-11-22 ENCOUNTER — Other Ambulatory Visit: Payer: Self-pay

## 2019-11-22 ENCOUNTER — Ambulatory Visit (INDEPENDENT_AMBULATORY_CARE_PROVIDER_SITE_OTHER): Payer: 59 | Admitting: Physician Assistant

## 2019-11-22 ENCOUNTER — Encounter: Payer: Self-pay | Admitting: Physician Assistant

## 2019-11-22 VITALS — BP 129/83 | HR 76 | Temp 98.8°F | Resp 16 | Wt 184.4 lb

## 2019-11-22 DIAGNOSIS — Z6836 Body mass index (BMI) 36.0-36.9, adult: Secondary | ICD-10-CM | POA: Diagnosis not present

## 2019-11-22 DIAGNOSIS — E282 Polycystic ovarian syndrome: Secondary | ICD-10-CM | POA: Diagnosis not present

## 2019-11-22 DIAGNOSIS — E034 Atrophy of thyroid (acquired): Secondary | ICD-10-CM

## 2019-11-22 DIAGNOSIS — Z1239 Encounter for other screening for malignant neoplasm of breast: Secondary | ICD-10-CM | POA: Diagnosis not present

## 2019-11-22 DIAGNOSIS — E782 Mixed hyperlipidemia: Secondary | ICD-10-CM

## 2019-11-22 DIAGNOSIS — Z0000 Encounter for general adult medical examination without abnormal findings: Secondary | ICD-10-CM

## 2019-11-22 NOTE — Patient Instructions (Signed)

## 2019-11-23 LAB — COMPREHENSIVE METABOLIC PANEL
ALT: 15 IU/L (ref 0–32)
AST: 16 IU/L (ref 0–40)
Albumin/Globulin Ratio: 1.8 (ref 1.2–2.2)
Albumin: 4.4 g/dL (ref 3.8–4.9)
Alkaline Phosphatase: 57 IU/L (ref 44–121)
BUN/Creatinine Ratio: 13 (ref 9–23)
BUN: 14 mg/dL (ref 6–24)
Bilirubin Total: 0.4 mg/dL (ref 0.0–1.2)
CO2: 21 mmol/L (ref 20–29)
Calcium: 9.1 mg/dL (ref 8.7–10.2)
Chloride: 105 mmol/L (ref 96–106)
Creatinine, Ser: 1.04 mg/dL — ABNORMAL HIGH (ref 0.57–1.00)
GFR calc Af Amer: 68 mL/min/{1.73_m2} (ref >59)
GFR calc non Af Amer: 59 mL/min/{1.73_m2} — ABNORMAL LOW (ref >59)
Globulin, Total: 2.5 g/dL (ref 1.5–4.5)
Glucose: 95 mg/dL (ref 65–99)
Potassium: 4.2 mmol/L (ref 3.5–5.2)
Sodium: 143 mmol/L (ref 134–144)
Total Protein: 6.9 g/dL (ref 6.0–8.5)

## 2019-11-23 LAB — CBC WITH DIFFERENTIAL/PLATELET
Basophils Absolute: 0.1 10*3/uL (ref 0.0–0.2)
Basos: 1 %
EOS (ABSOLUTE): 0.2 10*3/uL (ref 0.0–0.4)
Eos: 4 %
Hematocrit: 38.2 % (ref 34.0–46.6)
Hemoglobin: 13.3 g/dL (ref 11.1–15.9)
Immature Grans (Abs): 0 10*3/uL (ref 0.0–0.1)
Immature Granulocytes: 0 %
Lymphocytes Absolute: 2.2 10*3/uL (ref 0.7–3.1)
Lymphs: 32 %
MCH: 30.9 pg (ref 26.6–33.0)
MCHC: 34.8 g/dL (ref 31.5–35.7)
MCV: 89 fL (ref 79–97)
Monocytes Absolute: 0.5 10*3/uL (ref 0.1–0.9)
Monocytes: 7 %
Neutrophils Absolute: 3.9 10*3/uL (ref 1.4–7.0)
Neutrophils: 56 %
Platelets: 318 10*3/uL (ref 150–450)
RBC: 4.31 x10E6/uL (ref 3.77–5.28)
RDW: 12.5 % (ref 11.7–15.4)
WBC: 6.9 10*3/uL (ref 3.4–10.8)

## 2019-11-23 LAB — HEMOGLOBIN A1C
Est. average glucose Bld gHb Est-mCnc: 111 mg/dL
Hgb A1c MFr Bld: 5.5 % (ref 4.8–5.6)

## 2019-11-23 LAB — TSH: TSH: 4.84 u[IU]/mL — ABNORMAL HIGH (ref 0.450–4.500)

## 2019-11-23 LAB — LIPID PANEL
Chol/HDL Ratio: 8.4 ratio — ABNORMAL HIGH (ref 0.0–4.4)
Cholesterol, Total: 218 mg/dL — ABNORMAL HIGH (ref 100–199)
HDL: 26 mg/dL — ABNORMAL LOW (ref >39)
LDL Chol Calc (NIH): 134 mg/dL — ABNORMAL HIGH (ref 0–99)
Triglycerides: 318 mg/dL — ABNORMAL HIGH (ref 0–149)
VLDL Cholesterol Cal: 58 mg/dL — ABNORMAL HIGH (ref 5–40)

## 2019-11-25 ENCOUNTER — Ambulatory Visit
Admission: RE | Admit: 2019-11-25 | Discharge: 2019-11-25 | Disposition: A | Discharge status: Home / Self Care | Payer: 59 | Source: Ambulatory Visit | Attending: Physician Assistant | Admitting: Physician Assistant

## 2019-11-25 ENCOUNTER — Other Ambulatory Visit: Payer: Self-pay | Admitting: Physician Assistant

## 2019-11-25 ENCOUNTER — Other Ambulatory Visit: Payer: Self-pay

## 2019-11-25 DIAGNOSIS — Z1231 Encounter for screening mammogram for malignant neoplasm of breast: Secondary | ICD-10-CM | POA: Diagnosis not present

## 2019-11-25 DIAGNOSIS — E034 Atrophy of thyroid (acquired): Secondary | ICD-10-CM

## 2019-11-25 DIAGNOSIS — Z1239 Encounter for other screening for malignant neoplasm of breast: Secondary | ICD-10-CM

## 2019-11-25 DIAGNOSIS — E7800 Pure hypercholesterolemia, unspecified: Secondary | ICD-10-CM

## 2019-11-25 MED ORDER — PRAVASTATIN SODIUM 20 MG PO TABS: 20.0000 mg | ORAL_TABLET | Freq: Every day | ORAL | 3 refills | Status: DC

## 2019-11-25 MED ORDER — LEVOTHYROXINE SODIUM 137 MCG PO TABS: 137.0000 ug | ORAL_TABLET | Freq: Every day | ORAL | 3 refills | Status: DC

## 2019-11-25 NOTE — Progress Notes (Signed)
Pravastatin sent in and levothyroxine increased to based on labs

## 2020-05-11 ENCOUNTER — Other Ambulatory Visit: Payer: Self-pay

## 2020-05-11 ENCOUNTER — Other Ambulatory Visit: Payer: Self-pay | Admitting: Physician Assistant

## 2020-05-11 DIAGNOSIS — E782 Mixed hyperlipidemia: Secondary | ICD-10-CM

## 2020-05-11 MED ORDER — GEMFIBROZIL 600 MG PO TABS
ORAL_TABLET | ORAL | 0 refills | Status: DC
  Filled 2020-05-11: qty 180, 90d supply, fill #0

## 2020-05-11 MED FILL — Pravastatin Sodium Tab 20 MG: ORAL | 90 days supply | Qty: 90 | Fill #0 | Status: AC

## 2020-05-11 MED FILL — Levothyroxine Sodium Tab 137 MCG: ORAL | 90 days supply | Qty: 90 | Fill #0 | Status: AC

## 2020-08-10 ENCOUNTER — Other Ambulatory Visit: Payer: Self-pay | Admitting: Physician Assistant

## 2020-08-10 MED ORDER — NITROFURANTOIN MONOHYD MACRO 100 MG PO CAPS: 100.0000 mg | ORAL_CAPSULE | Freq: Two times a day (BID) | ORAL | 0 refills | Status: DC

## 2020-08-31 DIAGNOSIS — H524 Presbyopia: Secondary | ICD-10-CM | POA: Diagnosis not present

## 2020-09-04 ENCOUNTER — Other Ambulatory Visit: Payer: Self-pay

## 2020-09-04 MED FILL — Levothyroxine Sodium Tab 137 MCG: ORAL | 90 days supply | Qty: 90 | Fill #1 | Status: AC

## 2020-11-02 ENCOUNTER — Other Ambulatory Visit: Payer: Self-pay | Admitting: Physician Assistant

## 2020-11-02 ENCOUNTER — Other Ambulatory Visit: Payer: Self-pay

## 2020-11-02 MED ORDER — ORPHENADRINE CITRATE ER 100 MG PO TB12
100.0000 mg | ORAL_TABLET | Freq: Two times a day (BID) | ORAL | 0 refills | Status: DC
  Filled 2020-11-02: qty 10, 5d supply, fill #0

## 2020-11-17 IMAGING — MG DIGITAL SCREENING BILAT W/ TOMO W/ CAD
8 series · 8 of 24 positions shown · non-contrast
Comparison: Previous exam(s).

CLINICAL DATA: Screening.

EXAM:
DIGITAL SCREENING BILATERAL MAMMOGRAM WITH TOMO AND CAD

[L CC synth-2D]
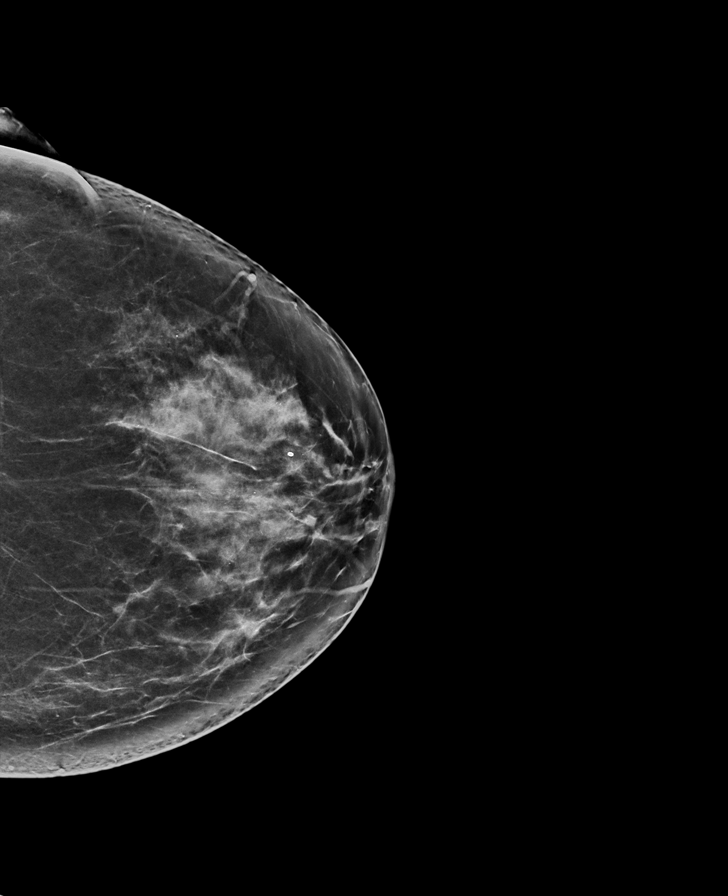

[L MLO synth-2D]
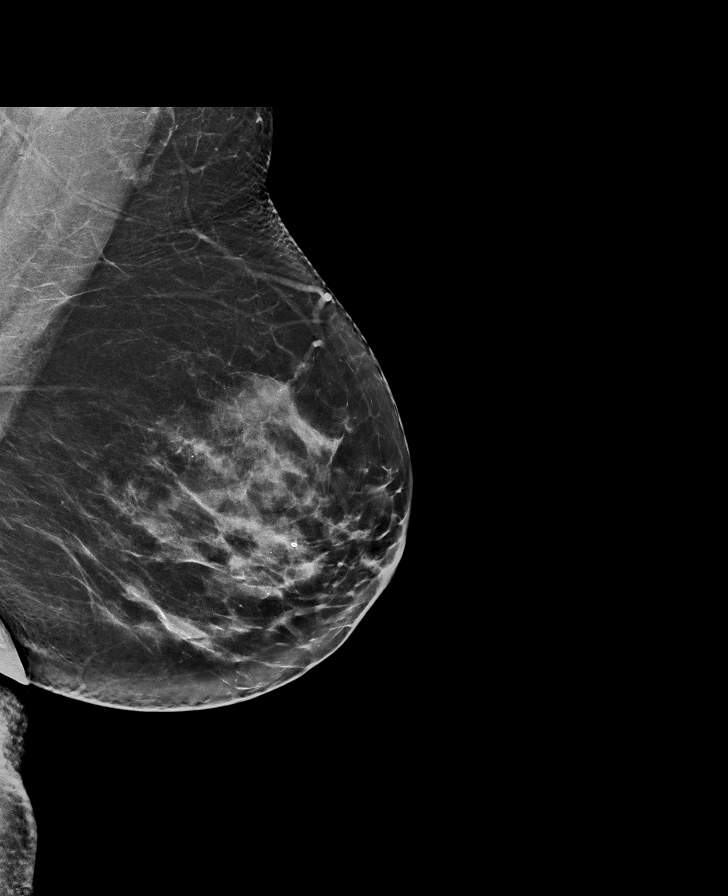

[R MLO synth-2D]
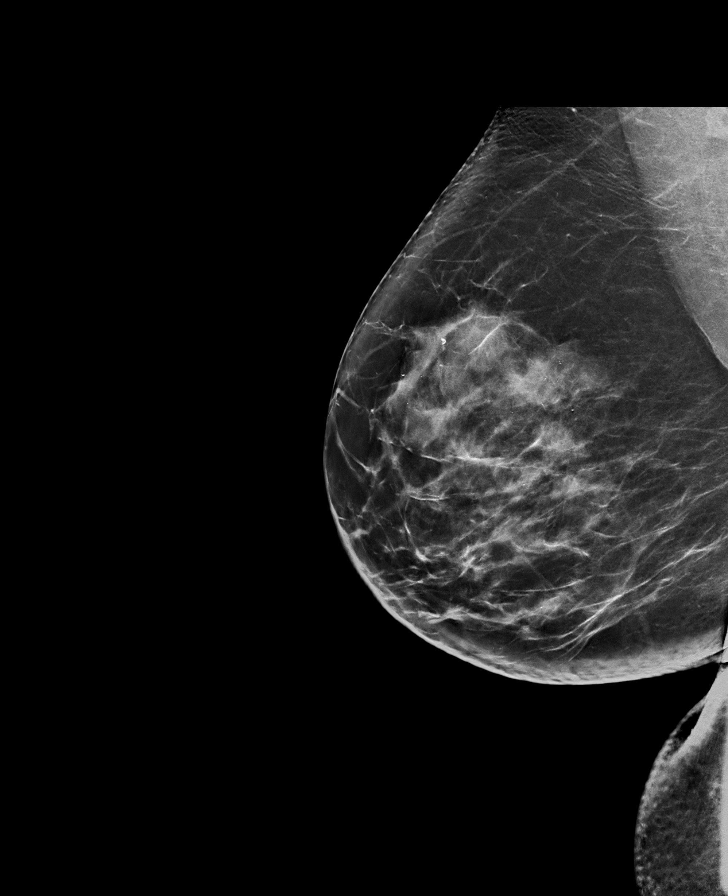

[R CC synth-2D]
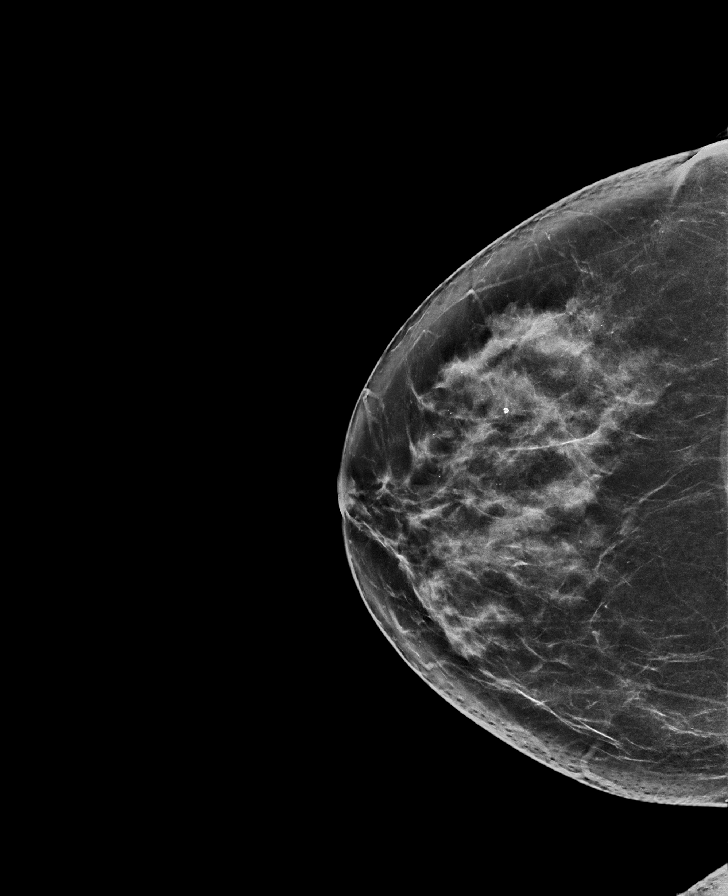

[R MLO tomo · tomo slice 43/85.0]
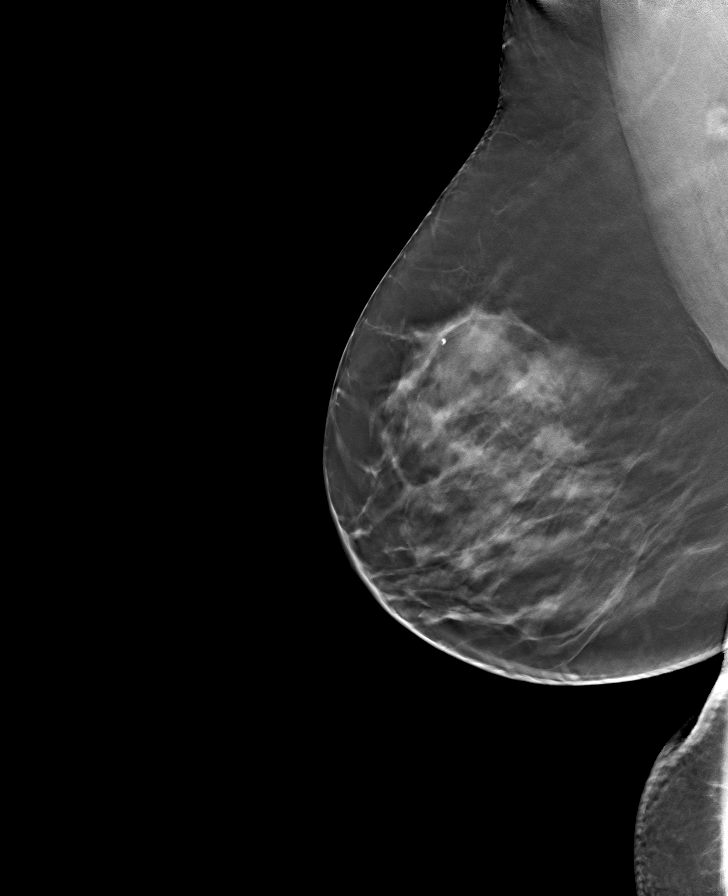

[L CC tomo · tomo slice 39/77.0]
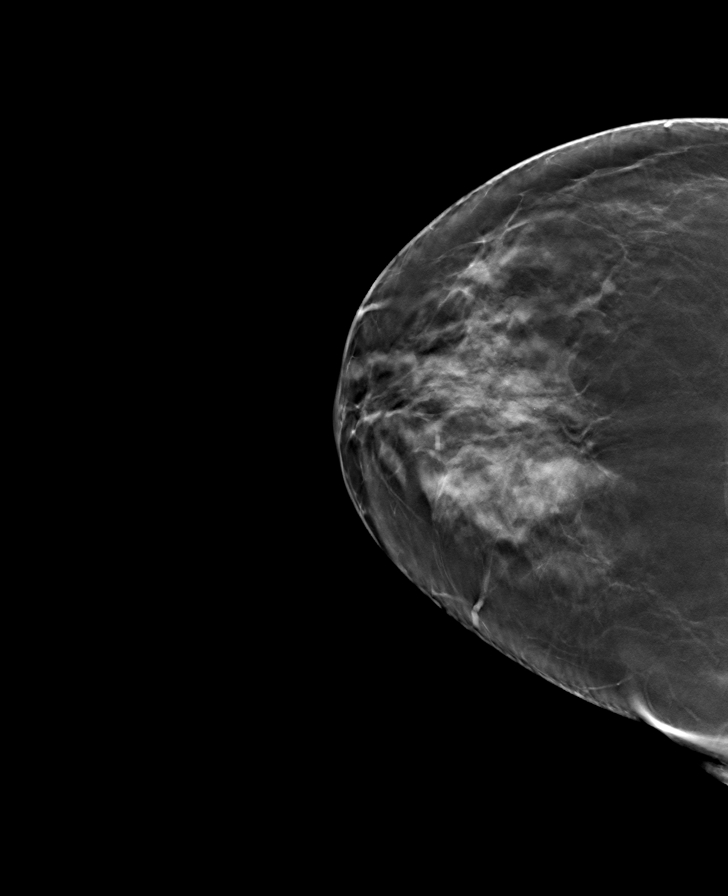

[R CC tomo · tomo slice 41/80.0]
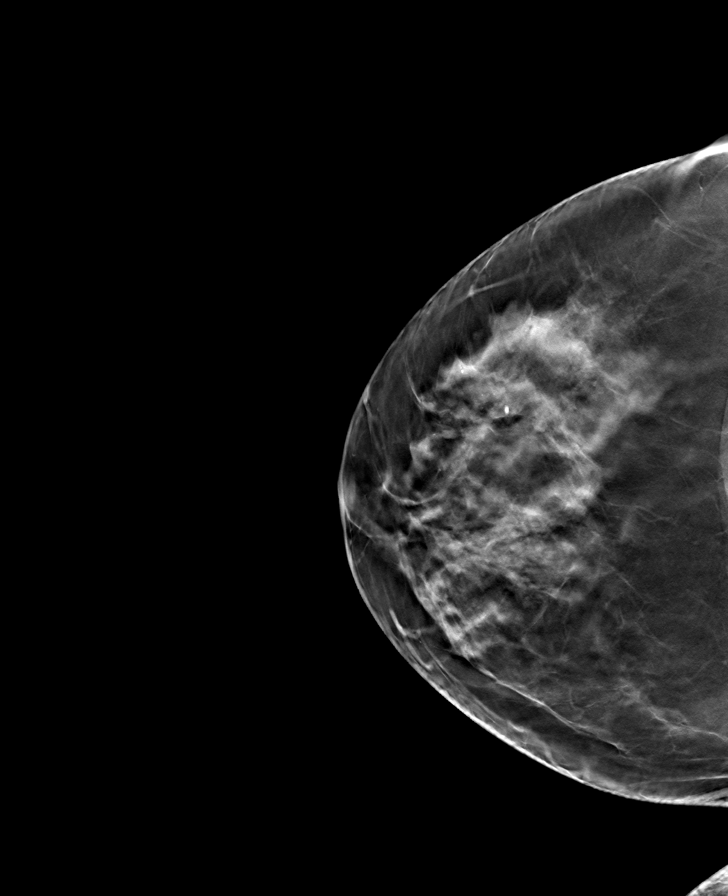

[L MLO tomo · tomo slice 42/83.0]
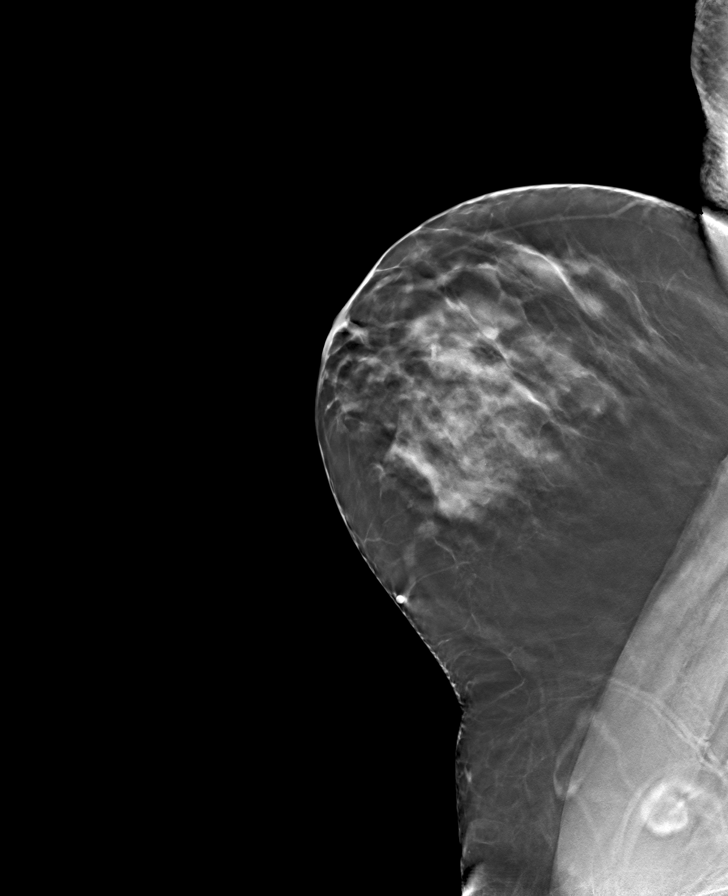

[8 of 24 positions shown; findings below may reference images not displayed]

ACR Breast Density Category c: The breast tissue is heterogeneously
dense, which may obscure small masses.
FINDINGS: There are no findings suspicious for malignancy. Images were
processed with CAD.
IMPRESSION: No mammographic evidence of malignancy. A result letter of this
screening mammogram will be mailed directly to the patient.

RECOMMENDATION:
Screening mammogram in one year. (Code:[id number])

BI-RADS CATEGORY  1: Negative.

## 2020-11-27 ENCOUNTER — Encounter: Payer: 59 | Admitting: Physician Assistant

## 2020-11-27 ENCOUNTER — Ambulatory Visit (INDEPENDENT_AMBULATORY_CARE_PROVIDER_SITE_OTHER): Payer: 59 | Admitting: Family Medicine

## 2020-11-27 ENCOUNTER — Other Ambulatory Visit: Payer: Self-pay

## 2020-11-27 ENCOUNTER — Encounter: Payer: Self-pay | Admitting: Family Medicine

## 2020-11-27 VITALS — BP 139/72 | HR 89 | Resp 16 | Ht 60.0 in | Wt 183.5 lb

## 2020-11-27 DIAGNOSIS — Z23 Encounter for immunization: Secondary | ICD-10-CM | POA: Diagnosis not present

## 2020-11-27 DIAGNOSIS — Z1159 Encounter for screening for other viral diseases: Secondary | ICD-10-CM | POA: Insufficient documentation

## 2020-11-27 DIAGNOSIS — R7989 Other specified abnormal findings of blood chemistry: Secondary | ICD-10-CM | POA: Insufficient documentation

## 2020-11-27 DIAGNOSIS — Z0000 Encounter for general adult medical examination without abnormal findings: Secondary | ICD-10-CM | POA: Insufficient documentation

## 2020-11-27 DIAGNOSIS — Z131 Encounter for screening for diabetes mellitus: Secondary | ICD-10-CM | POA: Insufficient documentation

## 2020-11-27 DIAGNOSIS — Z1231 Encounter for screening mammogram for malignant neoplasm of breast: Secondary | ICD-10-CM | POA: Diagnosis not present

## 2020-11-27 DIAGNOSIS — E282 Polycystic ovarian syndrome: Secondary | ICD-10-CM

## 2020-11-27 DIAGNOSIS — E786 Lipoprotein deficiency: Secondary | ICD-10-CM

## 2020-11-27 DIAGNOSIS — Z114 Encounter for screening for human immunodeficiency virus [HIV]: Secondary | ICD-10-CM | POA: Diagnosis not present

## 2020-11-27 DIAGNOSIS — R590 Localized enlarged lymph nodes: Secondary | ICD-10-CM | POA: Insufficient documentation

## 2020-11-27 NOTE — Assessment & Plan Note (Signed)
Repeat lipid panel Encourage healthy fats and exercise

## 2020-11-27 NOTE — Assessment & Plan Note (Signed)
Repeat sceening

## 2020-11-27 NOTE — Assessment & Plan Note (Signed)
1/2 provided

## 2020-11-27 NOTE — Assessment & Plan Note (Signed)
Repeat sceening  

## 2020-11-27 NOTE — Assessment & Plan Note (Signed)
bmi 35.84 Discussed importance of healthy weight management Discussed diet and exercise

## 2020-11-27 NOTE — Assessment & Plan Note (Signed)
Normal breast exam Due for mammogram

## 2020-11-27 NOTE — Assessment & Plan Note (Signed)
On metformin for prevention Hx of PCOS

## 2020-11-27 NOTE — Progress Notes (Signed)
Complete physical exam   Patient: Gail Hunter   DOB: 1960-12-18   60 y.o. Female  MRN: [id number] Visit Date: 11/27/2020  Today's healthcare provider: Jacky Kindle, FNP   Chief Complaint  Patient presents with   Annual Exam   Subjective     HPI  Gail Hunter is a 60 y.o. female who presents today for a complete physical exam.  She reports consuming a general diet. The patient does not participate in regular exercise at present. She generally feels fairly well. She reports sleeping fairly well on average 6-8hrs a night. She does have additional problems to discuss today and that is concerns of her weight.  Last Reported Pap-11/03/17 Mammo-11/25/2019 Past Medical History:  Diagnosis Date   Allergy    Anemia    History of chicken pox    History of measles    Thyroid disease    Past Surgical History:  Procedure Laterality Date   CESAREAN SECTION     CHOLECYSTECTOMY  02/2002   LAPAROSCOPIC ABDOMINAL EXPLORATION     Social History   Socioeconomic History   Marital status: Married    Spouse name: Not on file   Number of children: 1   Years of education: Not on file   Highest education level: Not on file  Occupational History   Not on file  Tobacco Use   Smoking status: Former    Packs/day: 0.25    Years: 2.00    Pack years: 0.50    Types: Cigarettes    Quit date: 01/17/2000    Years since quitting: 20.8   Smokeless tobacco: Never  Substance and Sexual Activity   Alcohol use: No   Drug use: No   Sexual activity: Not on file  Other Topics Concern   Not on file  Social History Narrative   Not on file   Social Determinants of Health   Financial Resource Strain: Not on file  Food Insecurity: Not on file  Transportation Needs: Not on file  Physical Activity: Not on file  Stress: Not on file  Social Connections: Not on file  Intimate Partner Violence: Not on file   Family Status  Relation Name Status   Mother  Alive   Father  Deceased       died  2016-02-06   Sister  Alive   Brother  Alive   Daughter  Alive   Family History  Problem Relation Age of Onset   Alzheimer's disease Mother    Thyroid disease Mother    Alzheimer's disease Father    Diabetes Sister    Hyperlipidemia Brother    No Known Allergies  Patient Care Team: Jacky Kindle, FNP as PCP - General (Family Medicine)   Medications: Outpatient Medications Prior to Visit  Medication Sig   Alpha-Lipoic Acid 600 MG CAPS Take by mouth.   Ascorbic Acid (VITAMIN C PO) Take by mouth. 500mg  tablet   B Complex Vitamins (B COMPLEX PO) Take by mouth.   cetirizine (ZYRTEC) 10 MG tablet Take 10 mg by mouth daily.   Cholecalciferol (VITAMIN D3) 5000 units TABS Take by mouth.   fluticasone (FLONASE) 50 MCG/ACT nasal spray Place into both nostrils daily.   gemfibrozil (LOPID) 600 MG tablet TAKE 1 TABLET BY MOUTH 2 TIMES DAILY BEFORE A MEAL.   levothyroxine (SYNTHROID) 137 MCG tablet TAKE 1 TABLET (137 MCG TOTAL) BY MOUTH DAILY BEFORE BREAKFAST.   metFORMIN (GLUCOPHAGE-XR) 500 MG 24 hr tablet TAKE 2 TABLETS BY  MOUTH DAILY WITH BREAKFAST.   Multiple Vitamin (MULTIVITAMINS PO) Take by mouth.   Omega-3 Fatty Acids (FISH OIL) 1200 MG CAPS Take by mouth.   spironolactone (ALDACTONE) 50 MG tablet Take 1 tablet (50 mg total) by mouth daily.   Zinc 50 MG TABS Take by mouth.   pravastatin (PRAVACHOL) 20 MG tablet TAKE 1 TABLET (20 MG TOTAL) BY MOUTH DAILY.   [DISCONTINUED] nitrofurantoin, macrocrystal-monohydrate, (MACROBID) 100 MG capsule Take 1 capsule (100 mg total) by mouth 2 (two) times daily.   [DISCONTINUED] orphenadrine (NORFLEX) 100 MG tablet Take 1 tablet (100 mg total) by mouth 2 (two) times daily.   No facility-administered medications prior to visit.    Review of Systems  Musculoskeletal:  Positive for myalgias.  All other systems reviewed and are negative.    Objective    BP 139/72   Pulse 89   Resp 16   Ht 5' (1.524 m)   Wt 183 lb 8 oz (83.2 kg)   LMP 03/17/2016    SpO2 94%   BMI 35.84 kg/m    Physical Exam Vitals and nursing note reviewed.  Constitutional:      General: She is awake. She is not in acute distress.    Appearance: Normal appearance. She is well-developed and well-groomed. She is obese. She is not ill-appearing, toxic-appearing or diaphoretic.  HENT:     Head: Normocephalic and atraumatic.     Jaw: There is normal jaw occlusion. No trismus, tenderness, swelling or pain on movement.     Right Ear: Hearing, tympanic membrane, ear canal and external ear normal. There is no impacted cerumen.     Left Ear: Hearing, tympanic membrane, ear canal and external ear normal. There is no impacted cerumen.     Nose: Nose normal. No congestion or rhinorrhea.     Right Turbinates: Not enlarged, swollen or pale.     Left Turbinates: Not enlarged, swollen or pale.     Right Sinus: No maxillary sinus tenderness or frontal sinus tenderness.     Left Sinus: No maxillary sinus tenderness or frontal sinus tenderness.     Mouth/Throat:     Lips: Pink.     Mouth: Mucous membranes are moist. No injury.     Tongue: No lesions.     Pharynx: Oropharynx is clear. Uvula midline. No pharyngeal swelling, oropharyngeal exudate, posterior oropharyngeal erythema or uvula swelling.     Tonsils: No tonsillar exudate or tonsillar abscesses.  Eyes:     General: Lids are normal. Lids are everted, no foreign bodies appreciated. Vision grossly intact. Gaze aligned appropriately. No allergic shiner or visual field deficit.       Right eye: No discharge.        Left eye: No discharge.     Extraocular Movements: Extraocular movements intact.     Conjunctiva/sclera: Conjunctivae normal.     Right eye: Right conjunctiva is not injected. No exudate.    Left eye: Left conjunctiva is not injected. No exudate.    Pupils: Pupils are equal, round, and reactive to light.  Neck:     Thyroid: No thyroid mass, thyromegaly or thyroid tenderness.     Vascular: No carotid bruit.      Trachea: Trachea normal.      Comments: Small, moveable mass noted 4:30 o'clock position Non painful Cardiovascular:     Rate and Rhythm: Normal rate and regular rhythm.     Pulses: Normal pulses.          Carotid pulses  are 2+ on the right side and 2+ on the left side.      Radial pulses are 2+ on the right side and 2+ on the left side.       Dorsalis pedis pulses are 2+ on the right side and 2+ on the left side.       Posterior tibial pulses are 2+ on the right side and 2+ on the left side.     Heart sounds: Normal heart sounds, S1 normal and S2 normal. No murmur heard.   No friction rub. No gallop.  Pulmonary:     Effort: Pulmonary effort is normal. No respiratory distress.     Breath sounds: Normal breath sounds and air entry. No stridor. No wheezing, rhonchi or rales.  Chest:     Chest wall: No tenderness.     Comments: Breast exam deferred; discussed 'know your lemons' campaign and self exam Abdominal:     General: Abdomen is flat. Bowel sounds are normal. There is no distension.     Palpations: Abdomen is soft. There is no mass.     Tenderness: There is no abdominal tenderness. There is no right CVA tenderness, left CVA tenderness, guarding or rebound.     Hernia: No hernia is present.  Genitourinary:    Comments: Exam deferred; denies complaints Musculoskeletal:        General: No swelling, tenderness, deformity or signs of injury. Normal range of motion.     Cervical back: Full passive range of motion without pain, normal range of motion and neck supple. No edema, rigidity or tenderness. No muscular tenderness.     Right lower leg: No edema.     Left lower leg: No edema.  Lymphadenopathy:     Cervical: No cervical adenopathy.     Right cervical: No superficial, deep or posterior cervical adenopathy.    Left cervical: No superficial, deep or posterior cervical adenopathy.  Skin:    General: Skin is warm and dry.     Capillary Refill: Capillary refill takes less than 2  seconds.     Coloration: Skin is not jaundiced or pale.     Findings: No bruising, erythema, lesion or rash.  Neurological:     General: No focal deficit present.     Mental Status: She is alert and oriented to person, place, and time. Mental status is at baseline.     GCS: GCS eye subscore is 4. GCS verbal subscore is 5. GCS motor subscore is 6.     Sensory: Sensation is intact. No sensory deficit.     Motor: Motor function is intact. No weakness.     Coordination: Coordination is intact. Coordination normal.     Gait: Gait is intact. Gait normal.  Psychiatric:        Attention and Perception: Attention and perception normal.        Mood and Affect: Mood and affect normal.        Speech: Speech normal.        Behavior: Behavior normal. Behavior is cooperative.        Thought Content: Thought content normal.        Cognition and Memory: Cognition and memory normal.        Judgment: Judgment normal.      Last depression screening scores PHQ 2/9 Scores 11/27/2020 11/22/2019 11/09/2018  PHQ - 2 Score 0 0 1  PHQ- 9 Score 1 - -   Last fall risk screening Fall Risk  11/22/2019  Falls  in the past year? 0  Number falls in past yr: 0  Injury with Fall? 0  Risk for fall due to : No Fall Risks  Follow up Falls evaluation completed   Last Audit-C alcohol use screening Alcohol Use Disorder Test (AUDIT) 11/22/2019  1. How often do you have a drink containing alcohol? 0  2. How many drinks containing alcohol do you have on a typical day when you are drinking? 0  3. How often do you have six or more drinks on one occasion? 0  AUDIT-C Score 0  Alcohol Brief Interventions/Follow-up AUDIT Score <7 follow-up not indicated   A score of 3 or more in women, and 4 or more in men indicates increased risk for alcohol abuse, EXCEPT if all of the points are from question 1   No results found for any visits on 11/27/20.  Assessment & Plan    Routine Health Maintenance and Physical Exam  Exercise  Activities and Dietary recommendations  Goals   None     Immunization History  Administered Date(s) Administered   Influenza Inj Mdck Quad With Preservative 11/12/2019   Influenza-Unspecified 10/11/2016, 10/10/2017, 10/25/2018   PFIZER(Purple Top)SARS-COV-2 Vaccination 02/04/2019, 02/26/2019, 11/12/2019   Td 09/13/2016   Tdap 09/13/2016   Zoster Recombinat (Shingrix) 11/27/2020    Health Maintenance  Topic Date Due   Hepatitis C Screening  Never done   COVID-19 Vaccine (4 - Booster for Pfizer series) 01/07/2020   INFLUENZA VACCINE  08/17/2020   PAP SMEAR-Modifier  11/03/2020   Zoster Vaccines- Shingrix (2 of 2) 01/22/2021   MAMMOGRAM  11/24/2021   COLONOSCOPY (Pts 45-18yrs Insurance coverage will need to be confirmed)  04/01/2026   TETANUS/TDAP  09/14/2026   HIV Screening  Completed   Pneumococcal Vaccine 15-40 Years old  Aged Out   HPV VACCINES  Aged Out    Discussed health benefits of physical activity, and encouraged her to engage in regular exercise appropriate for her age and condition.  Problem List Items Addressed This Visit       Endocrine   PCOS (polycystic ovarian syndrome) (Chronic)    Chronic, stable Repeat a1c On metformin as prevention      Relevant Orders   Hemoglobin A1c     Immune and Lymphatic   Enlarged lymph node in neck    Small, moveable mass noted 4:30 o'clock position Non painful       Relevant Orders   US Soft Tissue Head/Neck (NON-THYROID)     Other   Low HDL (under 40) (Chronic)    Repeat lipid panel Encourage healthy fats and exercise      Relevant Orders   Lipid panel   Annual physical exam    UTD on dental/eye exams Things to do to keep yourself healthy  - Exercise at least 30-45 minutes a day, 3-4 days a week.  - Eat a low-fat diet with lots of fruits and vegetables, up to 7-9 servings per day.  - Seatbelts can save your life. Wear them always.  - Smoke detectors on every level of your home, check batteries every  year.  - Eye Doctor - have an eye exam every 1-2 years  - Safe sex - if you may be exposed to STDs, use a condom.  - Alcohol -  If you drink, do it moderately, less than 2 drinks per day.  - Health Care Power of Attorney. Choose someone to speak for you if you are not able.  - Depression is common in  our stressful world.If you're feeling down or losing interest in things you normally enjoy, please come in for a visit.  - Violence - If anyone is threatening or hurting you, please call immediately.        Morbid obesity (HCC)    bmi 35.84 Discussed importance of healthy weight management Discussed diet and exercise       Relevant Orders   Comprehensive metabolic panel   CBC with Differential/Platelet   Hemoglobin A1c   Screening for diabetes mellitus (DM)    On metformin for prevention Hx of PCOS      Relevant Orders   Hemoglobin A1c   Encounter for screening for human immunodeficiency virus (HIV)    Repeat sceening      Relevant Orders   HIV antibody (with reflex)   Encounter for hepatitis C screening test for low risk patient    Repeat sceening       Relevant Orders   Hepatitis C Antibody   Elevated TSH    Repeat blood work      Relevant Orders   T4, free   TSH   Elevated serum creatinine - Primary    Repeat blood work      Breast cancer screening by mammogram    Normal breast exam Due for mammogram      Relevant Orders   MM 3D SCREEN BREAST BILATERAL   Need for shingles vaccine    1/2 provided      Relevant Orders   Varicella-zoster vaccine IM (Shingrix) (Completed)     Return in about 1 year (around 11/27/2021) for annual examination.     Leilani Merl, FNP, have reviewed all documentation for this visit. The documentation on 11/27/20 for the exam, diagnosis, procedures, and orders are all accurate and complete.    Jacky Kindle, FNP  [facility] [phone] (phone) [phone] (fax)  [facility]

## 2020-11-27 NOTE — Assessment & Plan Note (Signed)
Small, moveable mass noted 4:30 o'clock position Non painful

## 2020-11-27 NOTE — Assessment & Plan Note (Signed)
Chronic, stable Repeat a1c On metformin as prevention

## 2020-11-27 NOTE — Assessment & Plan Note (Signed)
Repeat blood work

## 2020-11-27 NOTE — Assessment & Plan Note (Signed)
UTD on dental/eye exams Things to do to keep yourself healthy  - Exercise at least 30-45 minutes a day, 3-4 days a week.  - Eat a low-fat diet with lots of fruits and vegetables, up to 7-9 servings per day.  - Seatbelts can save your life. Wear them always.  - Smoke detectors on every level of your home, check batteries every year.  - Eye Doctor - have an eye exam every 1-2 years  - Safe sex - if you may be exposed to STDs, use a condom.  - Alcohol -  If you drink, do it moderately, less than 2 drinks per day.  - Health Care Power of Attorney. Choose someone to speak for you if you are not able.  - Depression is common in our stressful world.If you're feeling down or losing interest in things you normally enjoy, please come in for a visit.  - Violence - If anyone is threatening or hurting you, please call immediately.   

## 2020-11-28 ENCOUNTER — Encounter: Payer: Self-pay | Admitting: Family Medicine

## 2020-11-28 LAB — LIPID PANEL
Chol/HDL Ratio: 5.5 ratio — ABNORMAL HIGH (ref 0.0–4.4)
Cholesterol, Total: 154 mg/dL (ref 100–199)
HDL: 28 mg/dL — ABNORMAL LOW (ref >39)
LDL Chol Calc (NIH): 78 mg/dL (ref 0–99)
Triglycerides: 293 mg/dL — ABNORMAL HIGH (ref 0–149)
VLDL Cholesterol Cal: 48 mg/dL — ABNORMAL HIGH (ref 5–40)

## 2020-11-28 LAB — COMPREHENSIVE METABOLIC PANEL
ALT: 17 IU/L (ref 0–32)
AST: 15 IU/L (ref 0–40)
Albumin/Globulin Ratio: 1.7 (ref 1.2–2.2)
Albumin: 4.5 g/dL (ref 3.8–4.9)
Alkaline Phosphatase: 62 IU/L (ref 44–121)
BUN/Creatinine Ratio: 14 (ref 9–23)
BUN: 16 mg/dL (ref 6–24)
Bilirubin Total: 0.5 mg/dL (ref 0.0–1.2)
CO2: 24 mmol/L (ref 20–29)
Calcium: 9.6 mg/dL (ref 8.7–10.2)
Chloride: 106 mmol/L (ref 96–106)
Creatinine, Ser: 1.13 mg/dL — ABNORMAL HIGH (ref 0.57–1.00)
Globulin, Total: 2.6 g/dL (ref 1.5–4.5)
Glucose: 98 mg/dL (ref 70–99)
Potassium: 4.8 mmol/L (ref 3.5–5.2)
Sodium: 144 mmol/L (ref 134–144)
Total Protein: 7.1 g/dL (ref 6.0–8.5)
eGFR: 56 mL/min/{1.73_m2} — ABNORMAL LOW (ref >59)

## 2020-11-28 LAB — CBC WITH DIFFERENTIAL/PLATELET
Basophils Absolute: 0 10*3/uL (ref 0.0–0.2)
Basos: 0 %
EOS (ABSOLUTE): 0.2 10*3/uL (ref 0.0–0.4)
Eos: 2 %
Hematocrit: 41.9 % (ref 34.0–46.6)
Hemoglobin: 14.4 g/dL (ref 11.1–15.9)
Immature Grans (Abs): 0 10*3/uL (ref 0.0–0.1)
Immature Granulocytes: 0 %
Lymphocytes Absolute: 2.4 10*3/uL (ref 0.7–3.1)
Lymphs: 31 %
MCH: 30.8 pg (ref 26.6–33.0)
MCHC: 34.4 g/dL (ref 31.5–35.7)
MCV: 90 fL (ref 79–97)
Monocytes Absolute: 0.6 10*3/uL (ref 0.1–0.9)
Monocytes: 8 %
Neutrophils Absolute: 4.4 10*3/uL (ref 1.4–7.0)
Neutrophils: 59 %
Platelets: 316 10*3/uL (ref 150–450)
RBC: 4.67 x10E6/uL (ref 3.77–5.28)
RDW: 13 % (ref 11.7–15.4)
WBC: 7.5 10*3/uL (ref 3.4–10.8)

## 2020-11-28 LAB — T4, FREE: Free T4: 1.67 ng/dL (ref 0.82–1.77)

## 2020-11-28 LAB — HEMOGLOBIN A1C
Est. average glucose Bld gHb Est-mCnc: 108 mg/dL
Hgb A1c MFr Bld: 5.4 % (ref 4.8–5.6)

## 2020-11-28 LAB — HEPATITIS C ANTIBODY: Hep C Virus Ab: 0.1 s/co ratio (ref 0.0–0.9)

## 2020-11-28 LAB — HIV ANTIBODY (ROUTINE TESTING W REFLEX): HIV Screen 4th Generation wRfx: NONREACTIVE

## 2020-11-28 LAB — TSH: TSH: 0.37 u[IU]/mL — ABNORMAL LOW (ref 0.450–4.500)

## 2020-11-30 ENCOUNTER — Other Ambulatory Visit: Payer: Self-pay | Admitting: Family Medicine

## 2020-11-30 ENCOUNTER — Other Ambulatory Visit: Payer: Self-pay

## 2020-11-30 MED ORDER — LEVOTHYROXINE SODIUM 100 MCG PO TABS
100.0000 ug | ORAL_TABLET | Freq: Every day | ORAL | 3 refills | Status: DC
  Filled 2020-11-30: qty 90, 90d supply, fill #0
  Filled 2021-03-08: qty 90, 90d supply, fill #1
  Filled 2021-06-17: qty 90, 90d supply, fill #2
  Filled 2021-09-17: qty 90, 90d supply, fill #3

## 2020-11-30 MED ORDER — ROSUVASTATIN CALCIUM 20 MG PO TABS
20.0000 mg | ORAL_TABLET | Freq: Every day | ORAL | 3 refills | Status: DC
  Filled 2020-11-30: qty 90, 90d supply, fill #0
  Filled 2021-06-17: qty 90, 90d supply, fill #1
  Filled 2021-09-17: qty 90, 90d supply, fill #2

## 2020-12-01 ENCOUNTER — Other Ambulatory Visit: Payer: Self-pay | Admitting: Family Medicine

## 2020-12-01 DIAGNOSIS — E782 Mixed hyperlipidemia: Secondary | ICD-10-CM

## 2020-12-01 DIAGNOSIS — E786 Lipoprotein deficiency: Secondary | ICD-10-CM

## 2020-12-01 MED ORDER — GEMFIBROZIL 600 MG PO TABS
ORAL_TABLET | ORAL | 0 refills | Status: DC
  Filled 2020-12-01: qty 180, 90d supply, fill #0

## 2020-12-01 NOTE — Telephone Encounter (Signed)
Requested Prescriptions  Pending Prescriptions Disp Refills  . gemfibrozil (LOPID) 600 MG tablet 180 tablet 0    Sig: TAKE 1 TABLET BY MOUTH 2 TIMES DAILY BEFORE A MEAL.     Cardiovascular:  Antilipid - Fibric Acid Derivatives Failed - 12/01/2020 10:28 PM      Failed - HDL in normal range and within 360 days    HDL  Date Value Ref Range Status  11/27/2020 28 (L) >39 mg/dL Final         Failed - Triglycerides in normal range and within 360 days    Triglycerides  Date Value Ref Range Status  11/27/2020 293 (H) 0 - 149 mg/dL Final         Failed - Cr in normal range and within 180 days    Creatinine, Ser  Date Value Ref Range Status  11/27/2020 1.13 (H) 0.57 - 1.00 mg/dL Final         Failed - eGFR in normal range and within 180 days    GFR calc Af Amer  Date Value Ref Range Status  11/22/2019 68 >59 mL/min/1.73 Final    Comment:    **In accordance with recommendations from the NKF-ASN Task force,**   [facility] is in the process of updating its eGFR calculation to the   2021 CKD-EPI creatinine equation that estimates kidney function   without a race variable.    GFR calc non Af Amer  Date Value Ref Range Status  11/22/2019 59 (L) >59 mL/min/1.73 Final   eGFR  Date Value Ref Range Status  11/27/2020 56 (L) >59 mL/min/1.73 Final         Passed - Total Cholesterol in normal range and within 360 days    Cholesterol, Total  Date Value Ref Range Status  11/27/2020 154 100 - 199 mg/dL Final         Passed - LDL in normal range and within 360 days    LDL Chol Calc (NIH)  Date Value Ref Range Status  11/27/2020 78 0 - 99 mg/dL Final         Passed - ALT in normal range and within 180 days    ALT  Date Value Ref Range Status  11/27/2020 17 0 - 32 IU/L Final         Passed - AST in normal range and within 180 days    AST  Date Value Ref Range Status  11/27/2020 15 0 - 40 IU/L Final         Passed - Valid encounter within last 12 months    Recent Outpatient Visits           4 days ago Annual physical exam   [facility] Gwyneth Sprout, FNP   1 year ago Annual physical exam   [facility] [place], Clearnce Sorrel, [place]   2 years ago Annual physical exam   [place], Clearnce Sorrel, [place]   3 years ago Encounter for annual physical exam   [facility] [place], Clearnce Sorrel, [place]   4 years ago Annual physical exam   [facility] [place], Clearnce Sorrel, [place]      Future Appointments            In 1 year Rollene Rotunda, Jaci Standard, [place], [place]

## 2020-12-02 ENCOUNTER — Other Ambulatory Visit: Payer: Self-pay

## 2020-12-04 ENCOUNTER — Other Ambulatory Visit: Payer: Self-pay

## 2020-12-04 ENCOUNTER — Ambulatory Visit
Admission: RE | Admit: 2020-12-04 | Discharge: 2020-12-04 | Disposition: A | Discharge status: Home / Self Care | Payer: 59 | Source: Ambulatory Visit | Attending: Family Medicine | Admitting: Family Medicine

## 2020-12-04 DIAGNOSIS — R590 Localized enlarged lymph nodes: Secondary | ICD-10-CM | POA: Insufficient documentation

## 2020-12-08 ENCOUNTER — Other Ambulatory Visit: Payer: Self-pay | Admitting: Family Medicine

## 2021-02-08 ENCOUNTER — Ambulatory Visit: Payer: Self-pay | Admitting: Physician Assistant

## 2021-02-08 ENCOUNTER — Other Ambulatory Visit: Payer: Self-pay | Admitting: Physician Assistant

## 2021-02-08 ENCOUNTER — Encounter: Payer: Self-pay | Admitting: Physician Assistant

## 2021-02-08 DIAGNOSIS — J020 Streptococcal pharyngitis: Secondary | ICD-10-CM

## 2021-02-08 MED ORDER — FEXOFENADINE-PSEUDOEPHED ER 60-120 MG PO TB12: 1.0000 | ORAL_TABLET | Freq: Two times a day (BID) | ORAL | 0 refills | Status: DC

## 2021-02-08 MED ORDER — AMOXICILLIN 875 MG PO TABS: 875.0000 mg | ORAL_TABLET | Freq: Two times a day (BID) | ORAL | 0 refills | Status: AC

## 2021-02-08 NOTE — Progress Notes (Signed)
° °  Subjective: Sore throat    Patient ID: Gail Hunter, female    DOB: [birth year], 61 y.o.   MRN: [id number]  HPI Patient presents with 2 days of sore throat and nasal congestion.  Denies recent travel or known contact with COVID-19.  Patient has been vaccinated for COVID-19 and influenza.   Review of Systems Hyperlipidemia, hypothyroidism, and PCOS.    Objective:   Physical Exam  Defers due to virtual visit.  Patient tested positive for strep pharyngitis.        Assessment & Plan: Strep pharyngitis.   Patient given discharge care instructions and prescription for amoxicillin and fexofenadine.

## 2021-03-08 ENCOUNTER — Encounter: Payer: Self-pay | Admitting: Family Medicine

## 2021-03-08 ENCOUNTER — Other Ambulatory Visit: Payer: Self-pay

## 2021-03-08 DIAGNOSIS — L680 Hirsutism: Secondary | ICD-10-CM

## 2021-03-08 DIAGNOSIS — E282 Polycystic ovarian syndrome: Secondary | ICD-10-CM

## 2021-03-09 ENCOUNTER — Other Ambulatory Visit: Payer: Self-pay

## 2021-03-09 DIAGNOSIS — L680 Hirsutism: Secondary | ICD-10-CM

## 2021-03-09 DIAGNOSIS — E282 Polycystic ovarian syndrome: Secondary | ICD-10-CM

## 2021-03-09 MED ORDER — SPIRONOLACTONE 50 MG PO TABS
50.0000 mg | ORAL_TABLET | Freq: Every day | ORAL | 4 refills | Status: DC
  Filled 2021-03-09: qty 90, 90d supply, fill #0
  Filled 2021-09-17: qty 80, 80d supply, fill #1
  Filled 2021-09-17: qty 10, 10d supply, fill #1
  Filled 2022-01-13: qty 90, 90d supply, fill #2

## 2021-03-09 MED ORDER — METFORMIN HCL ER 500 MG PO TB24
ORAL_TABLET | ORAL | 3 refills | Status: DC
  Filled 2021-03-09: qty 180, 90d supply, fill #0
  Filled 2021-09-17: qty 180, 90d supply, fill #1
  Filled 2022-01-13: qty 180, 90d supply, fill #2

## 2021-06-17 ENCOUNTER — Other Ambulatory Visit: Payer: Self-pay

## 2021-06-17 ENCOUNTER — Other Ambulatory Visit: Payer: Self-pay | Admitting: Family Medicine

## 2021-06-17 DIAGNOSIS — E782 Mixed hyperlipidemia: Secondary | ICD-10-CM

## 2021-06-17 MED ORDER — GEMFIBROZIL 600 MG PO TABS
ORAL_TABLET | ORAL | 0 refills | Status: DC
  Filled 2021-06-17: qty 180, 90d supply, fill #0

## 2021-06-18 ENCOUNTER — Other Ambulatory Visit: Payer: Self-pay

## 2021-08-27 ENCOUNTER — Other Ambulatory Visit: Payer: Self-pay

## 2021-08-27 DIAGNOSIS — E782 Mixed hyperlipidemia: Secondary | ICD-10-CM

## 2021-08-27 NOTE — Progress Notes (Signed)
Labs for outside provider. Gretel Acre

## 2021-08-28 LAB — CMP12+LP+TP+TSH+6AC+CBC/D/PLT
ALT: 11 IU/L (ref 0–32)
AST: 16 IU/L (ref 0–40)
Albumin/Globulin Ratio: 1.8 (ref 1.2–2.2)
Albumin: 4.4 g/dL (ref 3.8–4.9)
Alkaline Phosphatase: 61 IU/L (ref 44–121)
BUN/Creatinine Ratio: 17 (ref 9–23)
BUN: 18 mg/dL (ref 6–24)
Bilirubin Total: 0.4 mg/dL (ref 0.0–1.2)
Calcium: 9.2 mg/dL (ref 8.7–10.2)
Chloride: 103 mmol/L (ref 96–106)
Chol/HDL Ratio: 5.4 ratio — ABNORMAL HIGH (ref 0.0–4.4)
Cholesterol, Total: 151 mg/dL (ref 100–199)
Creatinine, Ser: 1.03 mg/dL — ABNORMAL HIGH (ref 0.57–1.00)
Estimated CHD Risk: 1.5 times avg. — ABNORMAL HIGH (ref 0.0–1.0)
Free Thyroxine Index: 1.8 (ref 1.2–4.9)
GGT: 14 IU/L (ref 0–60)
Globulin, Total: 2.4 g/dL (ref 1.5–4.5)
Glucose: 107 mg/dL — ABNORMAL HIGH (ref 70–99)
HDL: 28 mg/dL — ABNORMAL LOW (ref >39)
Iron: 70 ug/dL (ref 27–159)
LDH: 184 IU/L (ref 119–226)
LDL Chol Calc (NIH): 67 mg/dL (ref 0–99)
Phosphorus: 3.7 mg/dL (ref 3.0–4.3)
Potassium: 4.6 mmol/L (ref 3.5–5.2)
Sodium: 139 mmol/L (ref 134–144)
T3 Uptake Ratio: 22 % — ABNORMAL LOW (ref 24–39)
T4, Total: 8.4 ug/dL (ref 4.5–12.0)
TSH: 10.5 u[IU]/mL — ABNORMAL HIGH (ref 0.450–4.500)
Total Protein: 6.8 g/dL (ref 6.0–8.5)
Triglycerides: 356 mg/dL — ABNORMAL HIGH (ref 0–149)
Uric Acid: 5.6 mg/dL (ref 3.0–7.2)
VLDL Cholesterol Cal: 56 mg/dL — ABNORMAL HIGH (ref 5–40)
eGFR: 63 mL/min/{1.73_m2} (ref >59)

## 2021-09-17 ENCOUNTER — Other Ambulatory Visit: Payer: Self-pay

## 2021-09-17 ENCOUNTER — Other Ambulatory Visit: Payer: Self-pay | Admitting: Family Medicine

## 2021-09-17 DIAGNOSIS — E782 Mixed hyperlipidemia: Secondary | ICD-10-CM

## 2021-09-17 DIAGNOSIS — E039 Hypothyroidism, unspecified: Secondary | ICD-10-CM

## 2021-09-18 LAB — TSH+FREE T4
Free T4: 1.15 ng/dL (ref 0.82–1.77)
TSH: 6.93 u[IU]/mL — ABNORMAL HIGH (ref 0.450–4.500)

## 2021-09-21 ENCOUNTER — Other Ambulatory Visit: Payer: Self-pay

## 2021-09-21 MED ORDER — GEMFIBROZIL 600 MG PO TABS
ORAL_TABLET | ORAL | 0 refills | Status: DC
  Filled 2021-09-21 – 2021-11-16 (×2): qty 180, 90d supply, fill #0

## 2021-09-21 NOTE — Telephone Encounter (Signed)
Requested Prescriptions  Pending Prescriptions Disp Refills  . gemfibrozil (LOPID) 600 MG tablet 180 tablet 0    Sig: TAKE 1 TABLET BY MOUTH 2 TIMES DAILY BEFORE A MEAL.     Cardiovascular:  Antilipid - Fibric Acid Derivatives Failed - 09/17/2021  9:24 AM      Failed - Cr in normal range and within 360 days    Creatinine, Ser  Date Value Ref Range Status  08/27/2021 1.03 (H) 0.57 - 1.00 mg/dL Final         Failed - Lipid Panel in normal range within the last 12 months    Cholesterol, Total  Date Value Ref Range Status  08/27/2021 151 100 - 199 mg/dL Final   LDL Chol Calc (NIH)  Date Value Ref Range Status  08/27/2021 67 0 - 99 mg/dL Final   HDL  Date Value Ref Range Status  08/27/2021 28 (L) >39 mg/dL Final   Triglycerides  Date Value Ref Range Status  08/27/2021 356 (H) 0 - 149 mg/dL Final         Passed - ALT in normal range and within 360 days    ALT  Date Value Ref Range Status  08/27/2021 11 0 - 32 IU/L Final         Passed - AST in normal range and within 360 days    AST  Date Value Ref Range Status  08/27/2021 16 0 - 40 IU/L Final         Passed - HGB in normal range and within 360 days    Hemoglobin  Date Value Ref Range Status  08/27/2021 CANCELED      Comment:    Test not performed  Result canceled by the ancillary.          Passed - HCT in normal range and within 360 days    Hematocrit  Date Value Ref Range Status  08/27/2021 CANCELED      Comment:    Test not performed  Result canceled by the ancillary.          Passed - PLT in normal range and within 360 days    Platelets  Date Value Ref Range Status  08/27/2021 CANCELED      Comment:    Test not performed  Result canceled by the ancillary.          Passed - WBC in normal range and within 360 days    WBC  Date Value Ref Range Status  08/27/2021 CANCELED      Comment:    Test not performed  Result canceled by the ancillary.          Passed - eGFR is 30 or above and  within 360 days    GFR calc Af Amer  Date Value Ref Range Status  11/22/2019 68 >59 mL/min/1.73 Final    Comment:    **In accordance with recommendations from the NKF-ASN Task force,**   [facility] is in the process of updating its eGFR calculation to the   2021 CKD-EPI creatinine equation that estimates kidney function   without a race variable.    GFR calc non Af Amer  Date Value Ref Range Status  11/22/2019 59 (L) >59 mL/min/1.73 Final   eGFR  Date Value Ref Range Status  08/27/2021 63 >59 mL/min/1.73 Final         Passed - Valid encounter within last 12 months    Recent Outpatient Visits  9 months ago Annual physical exam   [facility] Gwyneth Sprout, FNP   1 year ago Annual physical exam   [facility] [place], Clearnce Sorrel, [place]   2 years ago Annual physical exam   [facility] [place], Clearnce Sorrel, [place]   3 years ago Encounter for annual physical exam   [facility] [place], Clearnce Sorrel, [place]   4 years ago Annual physical exam   [facility] [place], Clearnce Sorrel, [place]      Future Appointments            In 2 months Gwyneth Sprout, [place], [place]

## 2021-10-15 ENCOUNTER — Other Ambulatory Visit: Payer: Self-pay

## 2021-11-10 ENCOUNTER — Ambulatory Visit
Admission: RE | Admit: 2021-11-10 | Discharge: 2021-11-10 | Disposition: A | Discharge status: Home / Self Care | Payer: 59 | Source: Ambulatory Visit | Attending: Family Medicine | Admitting: Family Medicine

## 2021-11-10 DIAGNOSIS — Z1231 Encounter for screening mammogram for malignant neoplasm of breast: Secondary | ICD-10-CM | POA: Insufficient documentation

## 2021-11-12 ENCOUNTER — Other Ambulatory Visit: Payer: Self-pay | Admitting: Family Medicine

## 2021-11-12 DIAGNOSIS — N6489 Other specified disorders of breast: Secondary | ICD-10-CM

## 2021-11-12 DIAGNOSIS — R928 Other abnormal and inconclusive findings on diagnostic imaging of breast: Secondary | ICD-10-CM

## 2021-11-12 NOTE — Progress Notes (Signed)
You will be contacted for additional breast cancer screening to complete the screen.  Gail Hunter, [place] [address] [place],  [address] [phone] (phone) [phone] (fax) [place]

## 2021-11-16 ENCOUNTER — Other Ambulatory Visit: Payer: Self-pay

## 2021-11-17 ENCOUNTER — Other Ambulatory Visit: Payer: Self-pay

## 2021-11-17 ENCOUNTER — Ambulatory Visit
Admission: RE | Admit: 2021-11-17 | Discharge: 2021-11-17 | Disposition: A | Discharge status: Home / Self Care | Payer: 59 | Source: Ambulatory Visit | Attending: Family Medicine | Admitting: Family Medicine

## 2021-11-17 DIAGNOSIS — N6489 Other specified disorders of breast: Secondary | ICD-10-CM

## 2021-11-17 DIAGNOSIS — R928 Other abnormal and inconclusive findings on diagnostic imaging of breast: Secondary | ICD-10-CM | POA: Insufficient documentation

## 2021-11-17 DIAGNOSIS — R922 Inconclusive mammogram: Secondary | ICD-10-CM | POA: Diagnosis not present

## 2021-11-17 NOTE — Progress Notes (Signed)
Hi Yarianna  Normal mammogram; repeat in 1 year.  Please let us know if you have any questions.  Thank you,  Jing Howatt, FNP 

## 2021-11-27 IMAGING — US US SOFT TISSUE HEAD/NECK
1 series · 5 of 5 positions shown · non-contrast
Comparison: None.

CLINICAL DATA: Enlarged left submandibular lymph node

EXAM:
ULTRASOUND OF HEAD/NECK SOFT TISSUES
TECHNIQUE: Ultrasound examination of the head and neck soft tissues was
performed in the area of clinical concern.

[Series 1: us soft tissue head/neck · 0.07mm/px · 5 acquisitions, 5 frames shown]
[im 1/5]
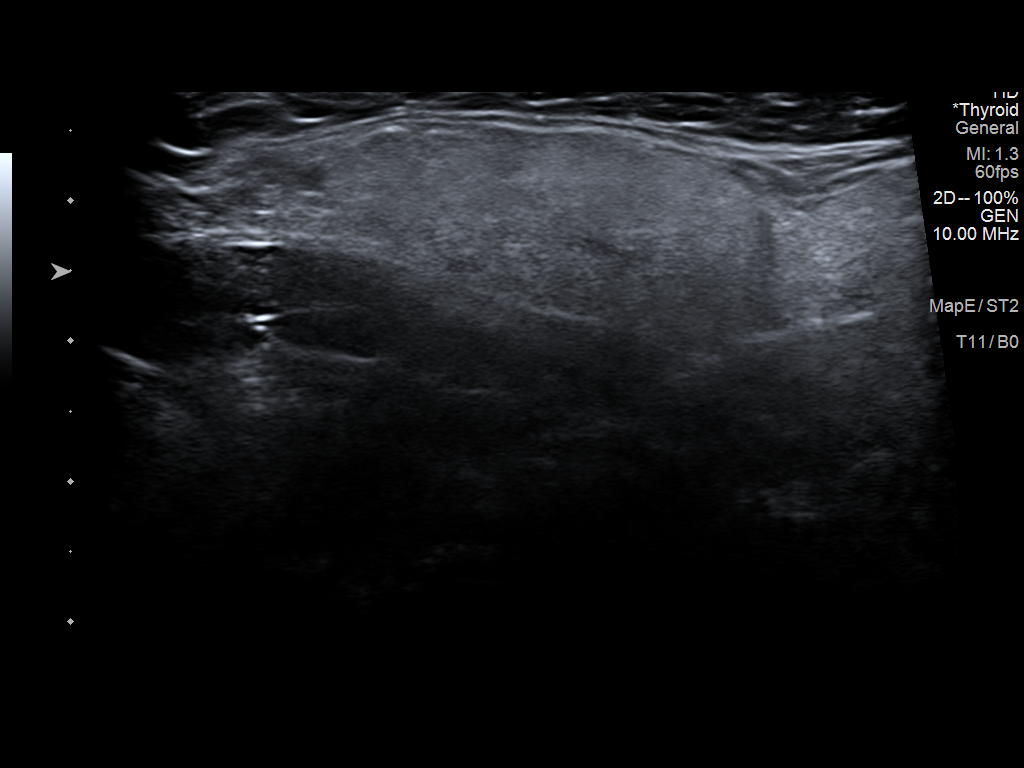
[im 2/5]
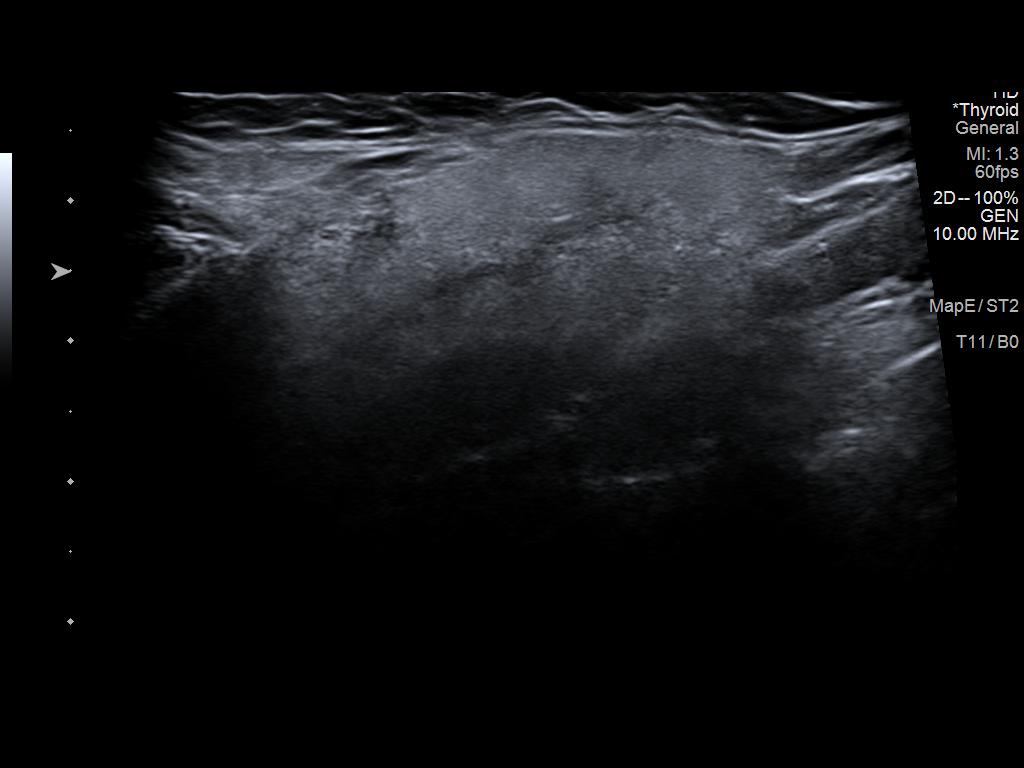
[im 3/5]
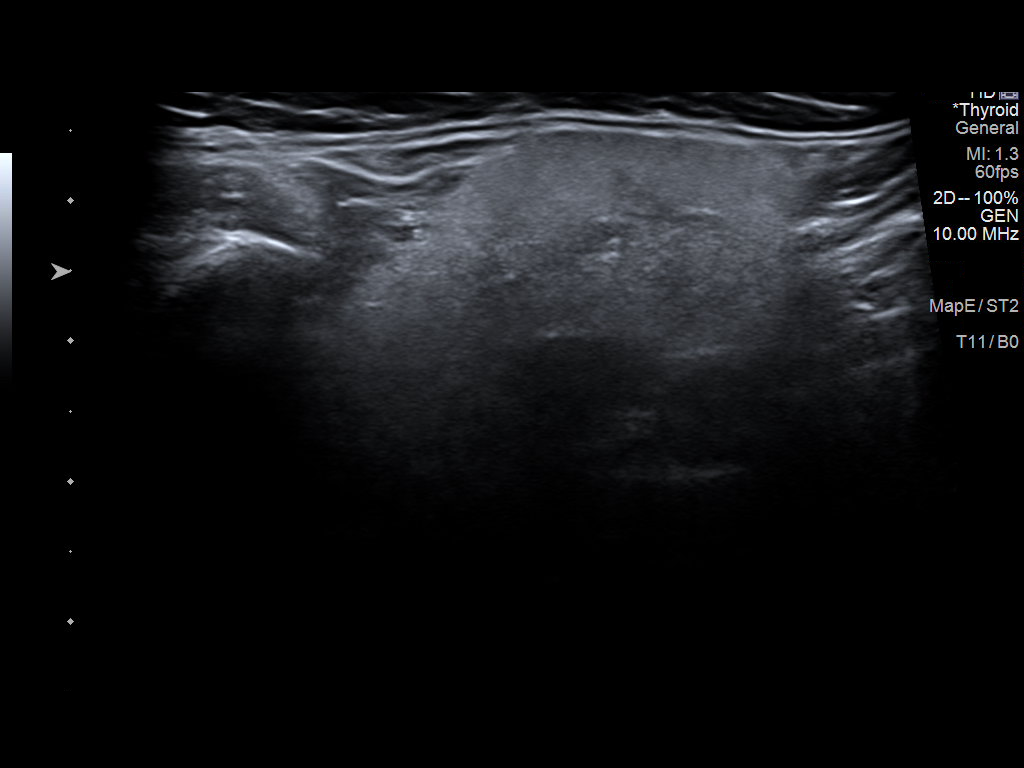
[im 4/5]
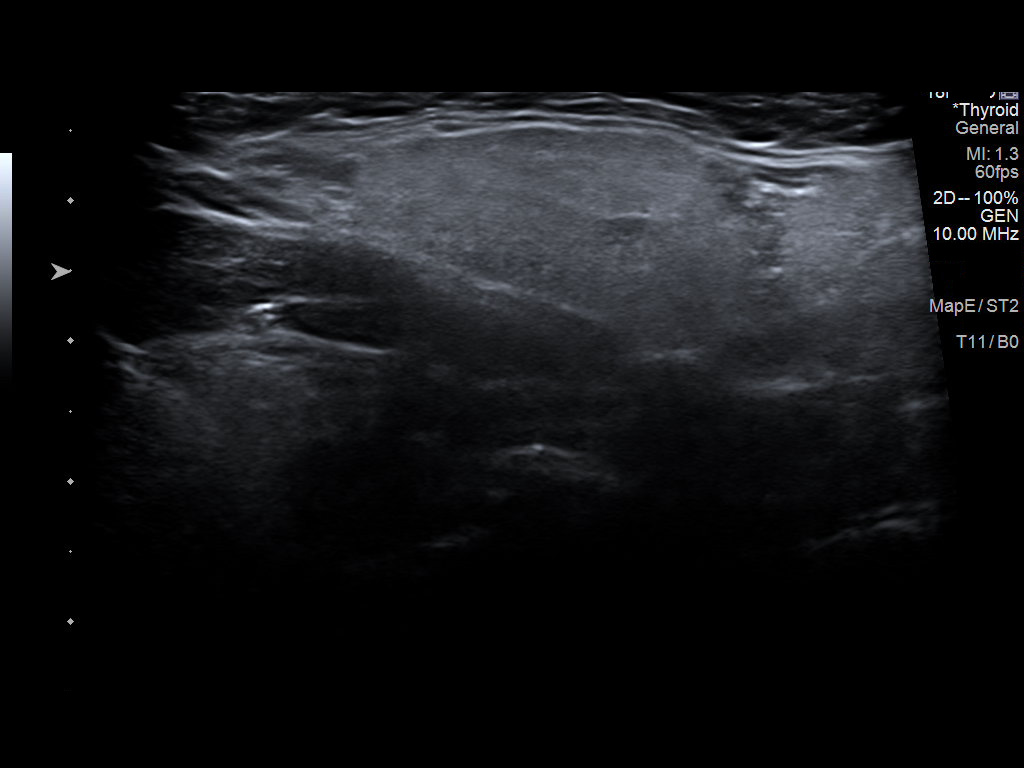
[im 5/5]
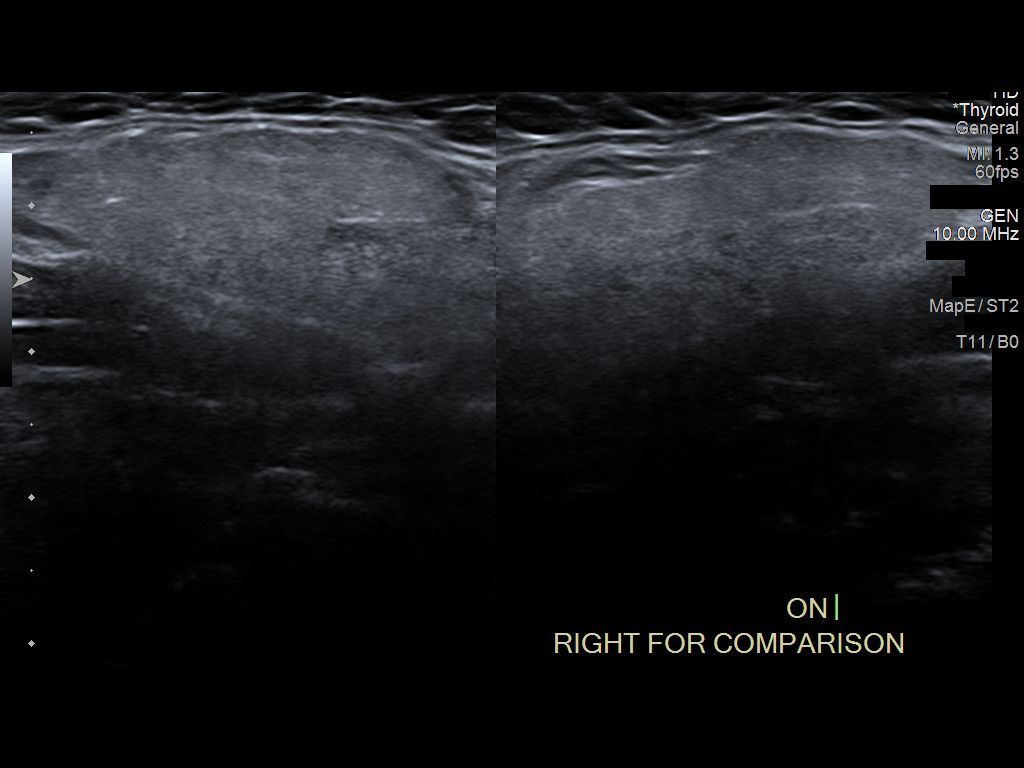

[5 of 5 positions shown; findings below may reference images not displayed]

FINDINGS: Ultrasound performed of the left submandibular/mandible area of
concern. Left submandibular gland appears normal. No other regional
soft tissue mass, cyst, fluid collection, or bulky adenopathy.
IMPRESSION: No significant left submandibular abnormality by ultrasound.

## 2021-11-29 NOTE — Progress Notes (Signed)
I,April Miller,acting as a scribe for Gwyneth Sprout, FNP.,have documented all relevant documentation on the behalf of Gwyneth Sprout, FNP,as directed by  Gwyneth Sprout, FNP while in the presence of Gwyneth Sprout, FNP. Complete physical exam  Patient: Gail Hunter   DOB: Jun 02, 1960   61 y.o. Female  MRN: [id number] Visit Date: 12/03/2021  Today's healthcare provider: Gwyneth Sprout, FNP  Re Introduced to nurse practitioner role and practice setting.  All questions answered.  Discussed provider/patient relationship and expectations.  Chief Complaint  Patient presents with   Annual Exam   Subjective    Gail Hunter is a 61 y.o. female who presents today for a complete physical exam.   HPI    Past Medical History:  Diagnosis Date   Allergy    Anemia    History of chicken pox    History of measles    Thyroid disease    Past Surgical History:  Procedure Laterality Date   CESAREAN SECTION     CHOLECYSTECTOMY  02/2002   LAPAROSCOPIC ABDOMINAL EXPLORATION     Social History   Socioeconomic History   Marital status: Married    Spouse name: Not on file   Number of children: 1   Years of education: Not on file   Highest education level: Not on file  Occupational History   Not on file  Tobacco Use   Smoking status: Former    Packs/day: 0.25    Years: 2.00    Total pack years: 0.50    Types: Cigarettes    Quit date: 01/17/2000    Years since quitting: 21.8   Smokeless tobacco: Never  Substance and Sexual Activity   Alcohol use: No   Drug use: No   Sexual activity: Not on file  Other Topics Concern   Not on file  Social History Narrative   Not on file   Social Determinants of Health   Financial Resource Strain: Not on file  Food Insecurity: Not on file  Transportation Needs: Not on file  Physical Activity: Not on file  Stress: Not on file  Social Connections: Not on file  Intimate Partner Violence: Not on file   Family Status  Relation Name Status   Mother   Alive   Father  Deceased       died 02/09/16   Sister  Alive   Daughter  Alive   Brother  Alive   Neg Hx  (Not Specified)   Family History  Problem Relation Age of Onset   Alzheimer's disease Mother    Thyroid disease Mother    Alzheimer's disease Father    Diabetes Sister    Hyperlipidemia Brother    Breast cancer Neg Hx    No Known Allergies  Patient Care Team: Gwyneth Sprout, FNP as PCP - General (Family Medicine)   Medications: Outpatient Medications Prior to Visit  Medication Sig   Cholecalciferol (VITAMIN D3) 5000 units TABS Take by mouth.   cyanocobalamin (VITAMIN B12) 1000 MCG tablet Take 1,000 mcg by mouth daily.   fluticasone (FLONASE) 50 MCG/ACT nasal spray Place into both nostrils daily.   gemfibrozil (LOPID) 600 MG tablet TAKE 1 TABLET BY MOUTH 2 TIMES DAILY BEFORE A MEAL.   Magnesium 250 MG TABS Take by mouth.   metFORMIN (GLUCOPHAGE-XR) 500 MG 24 hr tablet TAKE 2 TABLETS BY MOUTH DAILY WITH BREAKFAST.   Multiple Vitamin (MULTIVITAMINS PO) Take by mouth.   Omega-3 Fatty Acids (FISH OIL) 1200  MG CAPS Take by mouth.   rosuvastatin (CRESTOR) 20 MG tablet Take 1 tablet (20 mg total) by mouth daily.   spironolactone (ALDACTONE) 50 MG tablet Take 1 tablet (50 mg total) by mouth daily.   [DISCONTINUED] Alpha-Lipoic Acid 600 MG CAPS Take by mouth.   [DISCONTINUED] Ascorbic Acid (VITAMIN C PO) Take by mouth. 543m tablet   [DISCONTINUED] B Complex Vitamins (B COMPLEX PO) Take by mouth.   [DISCONTINUED] fexofenadine-pseudoephedrine (ALLEGRA-D) 60-120 MG 12 hr tablet Take 1 tablet by mouth 2 (two) times daily. (Patient not taking: Reported on 12/03/2021)   [DISCONTINUED] levothyroxine (SYNTHROID) 100 MCG tablet Take 1 tablet (100 mcg total) by mouth daily.   [DISCONTINUED] Zinc 50 MG TABS Take by mouth. (Patient not taking: Reported on 12/03/2021)   No facility-administered medications prior to visit.    Review of Systems  All other systems reviewed and are  negative.   Last CBC Lab Results  Component Value Date   WBC CANCELED 08/27/2021   HGB CANCELED 08/27/2021   HCT CANCELED 08/27/2021   MCV 90 11/27/2020   MCH 30.8 11/27/2020   RDW 13.0 11/27/2020   PLT CANCELED 016/10/9602  Last metabolic panel Lab Results  Component Value Date   GLUCOSE 107 (H) 08/27/2021   NA 139 08/27/2021   K 4.6 08/27/2021   CL 103 08/27/2021   CO2 24 11/27/2020   BUN 18 08/27/2021   CREATININE 1.03 (H) 08/27/2021   EGFR 63 08/27/2021   CALCIUM 9.2 08/27/2021   PHOS 3.7 08/27/2021   PROT 6.8 08/27/2021   ALBUMIN 4.4 08/27/2021   LABGLOB 2.4 08/27/2021   AGRATIO 1.8 08/27/2021   BILITOT 0.4 08/27/2021   ALKPHOS 61 08/27/2021   AST 16 08/27/2021   ALT 11 08/27/2021   Last lipids Lab Results  Component Value Date   CHOL 151 08/27/2021   HDL 28 (L) 08/27/2021   LDLCALC 67 08/27/2021   TRIG 356 (H) 08/27/2021   CHOLHDL 5.4 (H) 08/27/2021   Last hemoglobin A1c Lab Results  Component Value Date   HGBA1C 5.4 11/27/2020   Last thyroid functions Lab Results  Component Value Date   TSH 6.930 (H) 09/17/2021   T4TOTAL 8.4 08/27/2021   Last vitamin D No results found for: "25OHVITD2", "25OHVITD3", "VD25OH" Last vitamin B12 and Folate No results found for: "VITAMINB12", "FOLATE"    Objective    BP (!) 144/83 (BP Location: Right Arm, Patient Position: Sitting, Cuff Size: Normal)   Pulse 81   Temp 98.5 F (36.9 C) (Temporal)   Resp 16   Ht 5' (1.524 m)   Wt 184 lb (83.5 kg)   LMP 03/17/2016   SpO2 95%   BMI 35.94 kg/m   BP Readings from Last 3 Encounters:  12/03/21 (!) 144/83  11/27/20 139/72  11/22/19 129/83   Wt Readings from Last 3 Encounters:  12/03/21 184 lb (83.5 kg)  11/27/20 183 lb 8 oz (83.2 kg)  11/22/19 184 lb 6.4 oz (83.6 kg)   SpO2 Readings from Last 3 Encounters:  12/03/21 95%  11/27/20 94%  11/03/17 99%   Physical Exam Vitals and nursing note reviewed.  Constitutional:      General: She is awake. She is  not in acute distress.    Appearance: Normal appearance. She is well-developed and well-groomed. She is obese. She is not ill-appearing, toxic-appearing or diaphoretic.  HENT:     Head: Normocephalic and atraumatic.     Jaw: There is normal jaw occlusion. No trismus, tenderness, swelling  or pain on movement.     Right Ear: Hearing, tympanic membrane, ear canal and external ear normal. There is no impacted cerumen.     Left Ear: Hearing, tympanic membrane, ear canal and external ear normal. There is no impacted cerumen.     Nose: Nose normal. No congestion or rhinorrhea.     Right Turbinates: Not enlarged, swollen or pale.     Left Turbinates: Not enlarged, swollen or pale.     Right Sinus: No maxillary sinus tenderness or frontal sinus tenderness.     Left Sinus: No maxillary sinus tenderness or frontal sinus tenderness.     Mouth/Throat:     Lips: Pink.     Mouth: Mucous membranes are moist. No injury.     Tongue: No lesions.     Pharynx: Oropharynx is clear. Uvula midline. No pharyngeal swelling, oropharyngeal exudate, posterior oropharyngeal erythema or uvula swelling.     Tonsils: No tonsillar exudate or tonsillar abscesses.  Eyes:     General: Lids are normal. Lids are everted, no foreign bodies appreciated. Vision grossly intact. Gaze aligned appropriately. No allergic shiner or visual field deficit.       Right eye: No discharge.        Left eye: No discharge.     Extraocular Movements: Extraocular movements intact.     Conjunctiva/sclera: Conjunctivae normal.     Right eye: Right conjunctiva is not injected. No exudate.    Left eye: Left conjunctiva is not injected. No exudate.    Pupils: Pupils are equal, round, and reactive to light.  Neck:     Thyroid: No thyroid mass, thyromegaly or thyroid tenderness.     Vascular: No carotid bruit.     Trachea: Trachea normal.  Cardiovascular:     Rate and Rhythm: Normal rate and regular rhythm.     Pulses: Normal pulses.           Carotid pulses are 2+ on the right side and 2+ on the left side.      Radial pulses are 2+ on the right side and 2+ on the left side.       Dorsalis pedis pulses are 2+ on the right side and 2+ on the left side.       Posterior tibial pulses are 2+ on the right side and 2+ on the left side.     Heart sounds: Normal heart sounds, S1 normal and S2 normal. No murmur heard.    No friction rub. No gallop.  Pulmonary:     Effort: Pulmonary effort is normal. No respiratory distress.     Breath sounds: Normal breath sounds and air entry. No stridor. No wheezing, rhonchi or rales.  Chest:     Chest wall: No tenderness.  Abdominal:     General: Abdomen is flat. Bowel sounds are normal. There is no distension.     Palpations: Abdomen is soft. There is no mass.     Tenderness: There is no abdominal tenderness. There is no right CVA tenderness, left CVA tenderness, guarding or rebound.     Hernia: No hernia is present.  Genitourinary:    Comments: Exam deferred; denies complaints Musculoskeletal:        General: No swelling, tenderness, deformity or signs of injury. Normal range of motion.     Cervical back: Full passive range of motion without pain, normal range of motion and neck supple. No edema, rigidity or tenderness. No muscular tenderness.     Right lower leg:  No edema.     Left lower leg: No edema.  Lymphadenopathy:     Cervical: No cervical adenopathy.     Right cervical: No superficial, deep or posterior cervical adenopathy.    Left cervical: No superficial, deep or posterior cervical adenopathy.  Skin:    General: Skin is warm and dry.     Capillary Refill: Capillary refill takes less than 2 seconds.     Coloration: Skin is not jaundiced or pale.     Findings: No bruising, erythema, lesion or rash.  Neurological:     General: No focal deficit present.     Mental Status: She is alert and oriented to person, place, and time. Mental status is at baseline.     GCS: GCS eye subscore is 4.  GCS verbal subscore is 5. GCS motor subscore is 6.     Sensory: Sensation is intact. No sensory deficit.     Motor: Motor function is intact. No weakness.     Coordination: Coordination is intact. Coordination normal.     Gait: Gait is intact. Gait normal.  Psychiatric:        Attention and Perception: Attention and perception normal.        Mood and Affect: Mood and affect normal.        Speech: Speech normal.        Behavior: Behavior normal. Behavior is cooperative.        Thought Content: Thought content normal.        Cognition and Memory: Cognition and memory normal.        Judgment: Judgment normal.      Last depression screening scores    12/03/2021    9:19 AM 11/27/2020    9:45 AM 11/22/2019   10:18 AM  PHQ 2/9 Scores  PHQ - 2 Score 0 0 0  PHQ- 9 Score 0 1    Last fall risk screening    12/03/2021    9:20 AM  [place] in the past year? 0  Number falls in past yr: 0  Injury with Fall? 0  Risk for fall due to : No Fall Risks  Follow up Falls evaluation completed   Last Audit-C alcohol use screening    12/03/2021    9:20 AM  Alcohol Use Disorder Test (AUDIT)  1. How often do you have a drink containing alcohol? 0  2. How many drinks containing alcohol do you have on a typical day when you are drinking? 0  3. How often do you have six or more drinks on one occasion? 0  AUDIT-C Score 0   A score of 3 or more in women, and 4 or more in men indicates increased risk for alcohol abuse, EXCEPT if all of the points are from question 1   No results found for any visits on 12/03/21.  Assessment & Plan    Routine Health Maintenance and Physical Exam  Exercise Activities and Dietary recommendations  Goals   None     Immunization History  Administered Date(s) Administered   Influenza Inj Mdck Quad With Preservative 11/12/2019   Influenza-Unspecified 10/11/2016, 10/10/2017, 10/25/2018, 10/23/2020, 10/26/2021   PFIZER(Purple Top)SARS-COV-2 Vaccination  02/04/2019, 02/26/2019, 11/12/2019   Td 09/13/2016   Tdap 09/13/2016   Zoster Recombinat (Shingrix) 11/27/2020, 12/03/2021    Health Maintenance  Topic Date Due   COVID-19 Vaccine (4 - Pfizer series) 01/07/2020   PAP SMEAR-Modifier  11/04/2022   MAMMOGRAM  11/11/2023   COLONOSCOPY (Pts  45-17yr Insurance coverage will need to be confirmed)  04/01/2026   INFLUENZA VACCINE  Completed   Hepatitis C Screening  Completed   HIV Screening  Completed   Zoster Vaccines- Shingrix  Completed   HPV VACCINES  Aged Out    Discussed health benefits of physical activity, and encouraged her to engage in regular exercise appropriate for her age and condition.  Problem List Items Addressed This Visit       Endocrine   PCOS (polycystic ovarian syndrome) (Chronic)    Chronic, stable Notes difficulty losing weight Body mass index is 35.94 kg/m. Also hx of hirsutism Remains on metformin xr 500 mg BID and spiro at 50 mg daily Repeat A1c      Relevant Medications   Semaglutide-Weight Management 1 MG/0.5ML SOAJ   Other Relevant Orders   Hemoglobin A1c     Other   Low HDL (under 40) (Chronic)    Chronic, stable in 8/23 Repeat LP given mgmt of lopid 600 mg bid,  omega 3's, and crestor 20 mg I continue to recommend diet low in saturated fat and regular exercise - 30 min at least 5 times per week       Relevant Orders   Lipid panel   Annual physical exam - Primary    UTD on dental; due for vision Colon 2028 (10 years) prev Dr EVira AgarPAP 2024 (5 years) negative Mammo 11/12/22 Things to do to keep yourself healthy  - Exercise at least 30-45 minutes a day, 3-4 days a week.  - Eat a low-fat diet with lots of fruits and vegetables, up to 7-9 servings per day.  - Seatbelts can save your life. Wear them always.  - Smoke detectors on every level of your home, check batteries every year.  - Eye Doctor - have an eye exam every 1-2 years  - Safe sex - if you may be exposed to STDs, use a condom.   - Alcohol -  If you drink, do it moderately, less than 2 drinks per day.  - HFallis Choose someone to speak for you if you are not able.  - Depression is common in our stressful world.If you're feeling down or losing interest in things you normally enjoy, please come in for a visit.  - Violence - If anyone is threatening or hurting you, please call immediately.       Relevant Orders   Comprehensive metabolic panel   Lipid panel   CBC with Differential/Platelet   TSH   Hemoglobin A1c   Avitaminosis D   Relevant Orders   Vitamin D (25 hydroxy)   B12 deficiency   Relevant Orders   B12   Elevated blood pressure reading in office without diagnosis of hypertension   Elevated serum creatinine    Repeat CMP given previous elevations      Relevant Orders   Comprehensive metabolic panel   Elevated triglycerides with high cholesterol   Relevant Orders   Lipid panel   Elevated TSH    Chronic, reports 100-125 mcg of use; however, reports different use of manufacturers and one 'dissolves' quicker than the other Repeat labs       Relevant Orders   TSH   Morbid obesity ([place]    Chronic, stable Associated with HLD and PCOS Body mass index is 35.94 kg/m. Discussed importance of healthy weight management Discussed diet and exercise       Relevant Medications   Semaglutide-Weight Management 1 MG/0.5ML SOAJ  Need for shingles vaccine    2/2 dose given today; pt tolerated previous vaccination well       Relevant Orders   Zoster Recombinant (Shingrix ) (Completed)   Return in about 6 months (around 06/03/2022) for chonic disease management.    Vonna Kotyk, FNP, have reviewed all documentation for this visit. The documentation on 12/03/21 for the exam, diagnosis, procedures, and orders are all accurate and complete.  Gwyneth Sprout, [place] [phone] (phone) [phone] (fax)  [place]

## 2021-12-03 ENCOUNTER — Other Ambulatory Visit: Payer: Self-pay

## 2021-12-03 ENCOUNTER — Ambulatory Visit (INDEPENDENT_AMBULATORY_CARE_PROVIDER_SITE_OTHER): Payer: 59 | Admitting: Family Medicine

## 2021-12-03 ENCOUNTER — Encounter: Payer: Self-pay | Admitting: Family Medicine

## 2021-12-03 VITALS — BP 144/83 | HR 81 | Temp 98.5°F | Resp 16 | Ht 60.0 in | Wt 184.0 lb

## 2021-12-03 DIAGNOSIS — E282 Polycystic ovarian syndrome: Secondary | ICD-10-CM

## 2021-12-03 DIAGNOSIS — Z23 Encounter for immunization: Secondary | ICD-10-CM | POA: Diagnosis not present

## 2021-12-03 DIAGNOSIS — R7989 Other specified abnormal findings of blood chemistry: Secondary | ICD-10-CM | POA: Diagnosis not present

## 2021-12-03 DIAGNOSIS — Z0000 Encounter for general adult medical examination without abnormal findings: Secondary | ICD-10-CM

## 2021-12-03 DIAGNOSIS — E782 Mixed hyperlipidemia: Secondary | ICD-10-CM | POA: Diagnosis not present

## 2021-12-03 DIAGNOSIS — E559 Vitamin D deficiency, unspecified: Secondary | ICD-10-CM | POA: Diagnosis not present

## 2021-12-03 DIAGNOSIS — R030 Elevated blood-pressure reading, without diagnosis of hypertension: Secondary | ICD-10-CM

## 2021-12-03 DIAGNOSIS — E538 Deficiency of other specified B group vitamins: Secondary | ICD-10-CM | POA: Diagnosis not present

## 2021-12-03 DIAGNOSIS — E786 Lipoprotein deficiency: Secondary | ICD-10-CM

## 2021-12-03 MED ORDER — SEMAGLUTIDE-WEIGHT MANAGEMENT 1 MG/0.5ML ~~LOC~~ SOAJ
1.0000 mg | SUBCUTANEOUS | 0 refills | Status: DC
  Filled 2021-12-03 – 2021-12-06 (×2): qty 2, 28d supply, fill #0

## 2021-12-03 NOTE — Assessment & Plan Note (Signed)
Chronic, stable in 8/23 Repeat LP given mgmt of lopid 600 mg bid,  omega 3's, and crestor 20 mg I continue to recommend diet low in saturated fat and regular exercise - 30 min at least 5 times per week

## 2021-12-03 NOTE — Assessment & Plan Note (Signed)
Chronic, reports 100-125 mcg of use; however, reports different use of manufacturers and one 'dissolves' quicker than the other Repeat labs

## 2021-12-03 NOTE — Assessment & Plan Note (Addendum)
Chronic, stable Associated with HLD and PCOS Body mass index is 35.94 kg/m. Discussed importance of healthy weight management Discussed diet and exercise

## 2021-12-03 NOTE — Assessment & Plan Note (Signed)
2/2 dose given today; pt tolerated previous vaccination well

## 2021-12-03 NOTE — Assessment & Plan Note (Signed)
Repeat CMP given previous elevations

## 2021-12-03 NOTE — Assessment & Plan Note (Signed)
UTD on dental; due for vision Colon 2028 (10 years) prev Dr Mechele Collin PAP 2024 (5 years) negative Mammo 11/12/22 Things to do to keep yourself healthy  - Exercise at least 30-45 minutes a day, 3-4 days a week.  - Eat a low-fat diet with lots of fruits and vegetables, up to 7-9 servings per day.  - Seatbelts can save your life. Wear them always.  - Smoke detectors on every level of your home, check batteries every year.  - Eye Doctor - have an eye exam every 1-2 years  - Safe sex - if you may be exposed to STDs, use a condom.  - Alcohol -  If you drink, do it moderately, less than 2 drinks per day.  - Health Care Power of Attorney. Choose someone to speak for you if you are not able.  - Depression is common in our stressful world.If you're feeling down or losing interest in things you normally enjoy, please come in for a visit.  - Violence - If anyone is threatening or hurting you, please call immediately.

## 2021-12-03 NOTE — Assessment & Plan Note (Signed)
Chronic, stable Notes difficulty losing weight Body mass index is 35.94 kg/m. Also hx of hirsutism Remains on metformin xr 500 mg BID and spiro at 50 mg daily Repeat A1c

## 2021-12-04 LAB — TSH: TSH: 1.84 u[IU]/mL (ref 0.450–4.500)

## 2021-12-04 LAB — CBC WITH DIFFERENTIAL/PLATELET
Basophils Absolute: 0.1 10*3/uL (ref 0.0–0.2)
Basos: 1 %
EOS (ABSOLUTE): 0.2 10*3/uL (ref 0.0–0.4)
Eos: 2 %
Hematocrit: 40 % (ref 34.0–46.6)
Hemoglobin: 13.1 g/dL (ref 11.1–15.9)
Immature Grans (Abs): 0 10*3/uL (ref 0.0–0.1)
Immature Granulocytes: 0 %
Lymphocytes Absolute: 2.2 10*3/uL (ref 0.7–3.1)
Lymphs: 28 %
MCH: 29.6 pg (ref 26.6–33.0)
MCHC: 32.8 g/dL (ref 31.5–35.7)
MCV: 90 fL (ref 79–97)
Monocytes Absolute: 0.5 10*3/uL (ref 0.1–0.9)
Monocytes: 6 %
Neutrophils Absolute: 4.9 10*3/uL (ref 1.4–7.0)
Neutrophils: 63 %
Platelets: 366 10*3/uL (ref 150–450)
RBC: 4.43 x10E6/uL (ref 3.77–5.28)
RDW: 13 % (ref 11.7–15.4)
WBC: 7.7 10*3/uL (ref 3.4–10.8)

## 2021-12-04 LAB — HEMOGLOBIN A1C
Est. average glucose Bld gHb Est-mCnc: 105 mg/dL
Hgb A1c MFr Bld: 5.3 % (ref 4.8–5.6)

## 2021-12-04 LAB — LIPID PANEL
Chol/HDL Ratio: 3.8 ratio (ref 0.0–4.4)
Cholesterol, Total: 117 mg/dL (ref 100–199)
HDL: 31 mg/dL — ABNORMAL LOW (ref >39)
LDL Chol Calc (NIH): 51 mg/dL (ref 0–99)
Triglycerides: 215 mg/dL — ABNORMAL HIGH (ref 0–149)
VLDL Cholesterol Cal: 35 mg/dL (ref 5–40)

## 2021-12-04 LAB — COMPREHENSIVE METABOLIC PANEL
ALT: 10 IU/L (ref 0–32)
AST: 13 IU/L (ref 0–40)
Albumin/Globulin Ratio: 1.7 (ref 1.2–2.2)
Albumin: 4.6 g/dL (ref 3.8–4.9)
Alkaline Phosphatase: 53 IU/L (ref 44–121)
BUN/Creatinine Ratio: 17 (ref 12–28)
BUN: 17 mg/dL (ref 8–27)
Bilirubin Total: 0.4 mg/dL (ref 0.0–1.2)
CO2: 22 mmol/L (ref 20–29)
Calcium: 9.6 mg/dL (ref 8.7–10.3)
Chloride: 107 mmol/L — ABNORMAL HIGH (ref 96–106)
Creatinine, Ser: 1 mg/dL (ref 0.57–1.00)
Globulin, Total: 2.7 g/dL (ref 1.5–4.5)
Glucose: 86 mg/dL (ref 70–99)
Potassium: 4.9 mmol/L (ref 3.5–5.2)
Sodium: 146 mmol/L — ABNORMAL HIGH (ref 134–144)
Total Protein: 7.3 g/dL (ref 6.0–8.5)
eGFR: 64 mL/min/{1.73_m2} (ref >59)

## 2021-12-04 LAB — VITAMIN B12: Vitamin B-12: 757 pg/mL (ref 232–1245)

## 2021-12-04 LAB — VITAMIN D 25 HYDROXY (VIT D DEFICIENCY, FRACTURES): Vit D, 25-Hydroxy: 40.7 ng/mL (ref 30.0–100.0)

## 2021-12-05 NOTE — Progress Notes (Signed)
Cholesterol continues to look good; despite elevated in fats. ASCVD risk cannot be calculated as total is <130. I continue to recommend diet low in saturated fat and regular exercise - 30 min at least 5 times per week  All other labs, including TSH is stable. Please send in Rx request for 125 mcg when needed.  Jacky Kindle, FNP  [facility] [address] [place], [place] [address] [phone] (phone) [phone] (fax) [facility]

## 2021-12-06 ENCOUNTER — Other Ambulatory Visit: Payer: Self-pay

## 2021-12-06 MED ORDER — LEVOTHYROXINE SODIUM 125 MCG PO TABS
125.0000 ug | ORAL_TABLET | Freq: Every day | ORAL | 3 refills | Status: DC
  Filled 2021-12-06: qty 90, 90d supply, fill #0
  Filled 2022-02-08 – 2022-03-05 (×2): qty 90, 90d supply, fill #1
  Filled 2022-06-04: qty 90, 90d supply, fill #2
  Filled 2022-11-15: qty 90, 90d supply, fill #3

## 2021-12-06 NOTE — Addendum Note (Signed)
Addended by: Marlana Salvage on: 12/06/2021 11:53 AM   Modules accepted: Orders

## 2021-12-07 ENCOUNTER — Other Ambulatory Visit: Payer: Self-pay

## 2021-12-10 ENCOUNTER — Other Ambulatory Visit: Payer: Self-pay

## 2022-01-13 ENCOUNTER — Other Ambulatory Visit: Payer: Self-pay | Admitting: Family Medicine

## 2022-01-13 DIAGNOSIS — E282 Polycystic ovarian syndrome: Secondary | ICD-10-CM

## 2022-01-14 ENCOUNTER — Other Ambulatory Visit: Payer: Self-pay

## 2022-01-14 MED ORDER — ROSUVASTATIN CALCIUM 20 MG PO TABS
20.0000 mg | ORAL_TABLET | Freq: Every day | ORAL | 3 refills | Status: DC
  Filled 2022-01-14: qty 90, 90d supply, fill #0
  Filled 2022-04-11: qty 90, 90d supply, fill #1
  Filled 2022-08-25: qty 90, 90d supply, fill #2

## 2022-01-14 MED ORDER — WEGOVY 1 MG/0.5ML ~~LOC~~ SOAJ
1.0000 mg | SUBCUTANEOUS | 0 refills | Status: DC
  Filled 2022-01-14: qty 2, 28d supply, fill #0

## 2022-02-08 ENCOUNTER — Other Ambulatory Visit: Payer: Self-pay | Admitting: Family Medicine

## 2022-02-08 DIAGNOSIS — E782 Mixed hyperlipidemia: Secondary | ICD-10-CM

## 2022-02-08 DIAGNOSIS — E282 Polycystic ovarian syndrome: Secondary | ICD-10-CM

## 2022-02-09 ENCOUNTER — Other Ambulatory Visit: Payer: Self-pay

## 2022-02-09 MED ORDER — WEGOVY 1 MG/0.5ML ~~LOC~~ SOAJ
1.0000 mg | SUBCUTANEOUS | 0 refills | Status: DC
  Filled 2022-02-09: qty 2, 28d supply, fill #0

## 2022-02-09 MED ORDER — GEMFIBROZIL 600 MG PO TABS
600.0000 mg | ORAL_TABLET | Freq: Two times a day (BID) | ORAL | 0 refills | Status: DC
  Filled 2022-02-09: qty 180, 90d supply, fill #0

## 2022-03-05 ENCOUNTER — Other Ambulatory Visit: Payer: Self-pay | Admitting: Family Medicine

## 2022-03-05 DIAGNOSIS — E282 Polycystic ovarian syndrome: Secondary | ICD-10-CM

## 2022-03-07 ENCOUNTER — Other Ambulatory Visit: Payer: Self-pay

## 2022-03-07 MED ORDER — WEGOVY 1 MG/0.5ML ~~LOC~~ SOAJ
1.0000 mg | SUBCUTANEOUS | 0 refills | Status: DC
  Filled 2022-03-07: qty 2, 28d supply, fill #0

## 2022-04-11 ENCOUNTER — Other Ambulatory Visit: Payer: Self-pay

## 2022-04-11 ENCOUNTER — Other Ambulatory Visit: Payer: Self-pay | Admitting: Family Medicine

## 2022-04-11 DIAGNOSIS — E282 Polycystic ovarian syndrome: Secondary | ICD-10-CM

## 2022-04-11 DIAGNOSIS — L680 Hirsutism: Secondary | ICD-10-CM

## 2022-04-11 MED ORDER — SPIRONOLACTONE 50 MG PO TABS
50.0000 mg | ORAL_TABLET | Freq: Every day | ORAL | 4 refills | Status: DC
  Filled 2022-04-11: qty 90, 90d supply, fill #0
  Filled 2022-08-25: qty 90, 90d supply, fill #1

## 2022-04-11 MED ORDER — METFORMIN HCL ER 500 MG PO TB24
1000.0000 mg | ORAL_TABLET | Freq: Every day | ORAL | 3 refills | Status: DC
  Filled 2022-04-11: qty 180, 90d supply, fill #0
  Filled 2022-08-25: qty 180, 90d supply, fill #1

## 2022-04-11 MED ORDER — WEGOVY 1 MG/0.5ML ~~LOC~~ SOAJ
1.0000 mg | SUBCUTANEOUS | 0 refills | Status: DC
  Filled 2022-04-11: qty 2, 28d supply, fill #0

## 2022-04-15 ENCOUNTER — Other Ambulatory Visit: Payer: Self-pay

## 2022-04-15 ENCOUNTER — Telehealth: Payer: Self-pay | Admitting: Family Medicine

## 2022-04-15 ENCOUNTER — Encounter: Payer: Self-pay | Admitting: Family Medicine

## 2022-04-15 NOTE — Telephone Encounter (Signed)
Covermymeds requesting prior authorization due to additional action needed Key: [place] Name: [place] No medication listed

## 2022-04-15 NOTE — Progress Notes (Unsigned)
PA for [facility] in covermymeds Key: [id number] started today.

## 2022-04-15 NOTE — Telephone Encounter (Signed)
PA not needed. She has one approved on file from previous. Message from Plan. Member has an active PA on file which is expiring on 04/23/2024. Verified with [facility] pharmacy and patient has picked up medication.

## 2022-04-19 ENCOUNTER — Telehealth: Payer: Self-pay | Admitting: Family Medicine

## 2022-04-19 NOTE — Telephone Encounter (Signed)
Covermymeds requesting prior authorization Key: [place] Name: Gail Hunter No medication name listed

## 2022-04-20 NOTE — Telephone Encounter (Signed)
ARCHIVED BY SULIBEYA DIMAS 5 days ago

## 2022-05-02 ENCOUNTER — Other Ambulatory Visit: Payer: Self-pay

## 2022-05-06 ENCOUNTER — Other Ambulatory Visit: Payer: Self-pay | Admitting: Family Medicine

## 2022-05-06 DIAGNOSIS — E782 Mixed hyperlipidemia: Secondary | ICD-10-CM

## 2022-05-06 DIAGNOSIS — E282 Polycystic ovarian syndrome: Secondary | ICD-10-CM

## 2022-05-09 ENCOUNTER — Other Ambulatory Visit: Payer: Self-pay

## 2022-05-09 MED ORDER — GEMFIBROZIL 600 MG PO TABS
600.0000 mg | ORAL_TABLET | Freq: Two times a day (BID) | ORAL | 0 refills | Status: DC
  Filled 2022-05-09: qty 180, 90d supply, fill #0

## 2022-05-09 MED ORDER — WEGOVY 1 MG/0.5ML ~~LOC~~ SOAJ
1.0000 mg | SUBCUTANEOUS | 0 refills | Status: DC
  Filled 2022-05-09: qty 2, 28d supply, fill #0

## 2022-05-09 NOTE — Telephone Encounter (Signed)
Requested Prescriptions  Pending Prescriptions Disp Refills   gemfibrozil (LOPID) 600 MG tablet 180 tablet 0    Sig: Take 1 tablet (600 mg total) by mouth 2 (two) times daily before a meal.     Cardiovascular:  Antilipid - Fibric Acid Derivatives Failed - 05/06/2022  6:42 PM      Failed - Lipid Panel in normal range within the last 12 months    Cholesterol, Total  Date Value Ref Range Status  12/03/2021 117 100 - 199 mg/dL Final   LDL Chol Calc (NIH)  Date Value Ref Range Status  12/03/2021 51 0 - 99 mg/dL Final   HDL  Date Value Ref Range Status  12/03/2021 31 (L) >39 mg/dL Final   Triglycerides  Date Value Ref Range Status  12/03/2021 215 (H) 0 - 149 mg/dL Final         Passed - ALT in normal range and within 360 days    ALT  Date Value Ref Range Status  12/03/2021 10 0 - 32 IU/L Final         Passed - AST in normal range and within 360 days    AST  Date Value Ref Range Status  12/03/2021 13 0 - 40 IU/L Final         Passed - Cr in normal range and within 360 days    Creatinine, Ser  Date Value Ref Range Status  12/03/2021 1.00 0.57 - 1.00 mg/dL Final         Passed - HGB in normal range and within 360 days    Hemoglobin  Date Value Ref Range Status  12/03/2021 13.1 11.1 - 15.9 g/dL Final         Passed - HCT in normal range and within 360 days    Hematocrit  Date Value Ref Range Status  12/03/2021 40.0 34.0 - 46.6 % Final         Passed - PLT in normal range and within 360 days    Platelets  Date Value Ref Range Status  12/03/2021 366 150 - 450 x10E3/uL Final         Passed - WBC in normal range and within 360 days    WBC  Date Value Ref Range Status  12/03/2021 7.7 3.4 - 10.8 x10E3/uL Final         Passed - eGFR is 30 or above and within 360 days    GFR calc Af Amer  Date Value Ref Range Status  11/22/2019 68 >59 mL/min/1.73 Final    Comment:    **In accordance with recommendations from the NKF-ASN Task force,**   [facility] is in the process  of updating its eGFR calculation to the   2021 CKD-EPI creatinine equation that estimates kidney function   without a race variable.    GFR calc non Af Amer  Date Value Ref Range Status  11/22/2019 59 (L) >59 mL/min/1.73 Final   eGFR  Date Value Ref Range Status  12/03/2021 64 >59 mL/min/1.73 Final         Passed - Valid encounter within last 12 months    Recent Outpatient Visits           5 months ago Annual physical exam   [facility] Jacky Kindle, FNP   1 year ago Annual physical exam   [facility] Jacky Kindle, FNP   2 years ago Annual physical exam   [facility]  Margaretann Loveless, PA-C   3 years ago Annual physical exam   [facility] [place], [place], [place]   4 years ago Encounter for annual physical exam   [facility] [place], [place], [place]       Future Appointments             In 4 weeks Jacky Kindle, FNP [facility]   In 7 months Jacky Kindle, FNP [facility]             Semaglutide-Weight Management (WEGOVY) 1 MG/0.5ML SOAJ 2 mL 0    Sig: Inject 1 mg into the skin once a week.     Endocrinology:  Diabetes - GLP-1 Receptor Agonists - semaglutide Passed - 05/06/2022  6:42 PM      Passed - HBA1C in normal range and within 180 days    Hgb A1c MFr Bld  Date Value Ref Range Status  12/03/2021 5.3 4.8 - 5.6 % Final    Comment:             Prediabetes: 5.7 - 6.4          Diabetes: >6.4          Glycemic control for adults with diabetes: <7.0          Passed - Cr in normal range and within 360 days    Creatinine, Ser  Date Value Ref Range Status  12/03/2021 1.00 0.57 - 1.00 mg/dL Final         Passed - Valid encounter within last 6 months    Recent Outpatient Visits           5 months ago Annual physical exam   [facility] Jacky Kindle, FNP   1 year ago Annual physical exam   [facility] Jacky Kindle, FNP   2 years ago Annual physical exam   [facility] [place], [place], [place]   3 years ago Annual physical exam   [facility] [place], [place], [place]   4 years ago Encounter for annual physical exam   [facility] [place], [place], [place]       Future Appointments             In 4 weeks Jacky Kindle, FNP [facility]   In 7 months Suzie Portela, Daryl Eastern, FNP [facility]

## 2022-06-07 ENCOUNTER — Ambulatory Visit: Payer: Commercial Managed Care - PPO | Admitting: Family Medicine

## 2022-06-07 ENCOUNTER — Encounter: Payer: Self-pay | Admitting: Family Medicine

## 2022-06-07 VITALS — BP 134/82 | HR 86 | Ht 60.0 in | Wt 172.4 lb

## 2022-06-07 DIAGNOSIS — E782 Mixed hyperlipidemia: Secondary | ICD-10-CM

## 2022-06-07 DIAGNOSIS — E6609 Other obesity due to excess calories: Secondary | ICD-10-CM | POA: Insufficient documentation

## 2022-06-07 DIAGNOSIS — Z6833 Body mass index (BMI) 33.0-33.9, adult: Secondary | ICD-10-CM

## 2022-06-07 DIAGNOSIS — E559 Vitamin D deficiency, unspecified: Secondary | ICD-10-CM

## 2022-06-07 DIAGNOSIS — E039 Hypothyroidism, unspecified: Secondary | ICD-10-CM | POA: Diagnosis not present

## 2022-06-07 DIAGNOSIS — E538 Deficiency of other specified B group vitamins: Secondary | ICD-10-CM | POA: Diagnosis not present

## 2022-06-07 MED ORDER — SEMAGLUTIDE-WEIGHT MANAGEMENT 1.7 MG/0.75ML ~~LOC~~ SOAJ: 1.7000 mg | SUBCUTANEOUS | 5 refills | Status: DC

## 2022-06-07 NOTE — Assessment & Plan Note (Signed)
Chronic obesity; with 10-15 lbs weight loss on Wegovy 1 mg; now notes that [facility] Lutes is not covered and request for paper Rx for Wegovy to be fulfilled at a compounding pharmacy  Continue to monitor  Continue to recommend balanced, lower carb meals. Smaller meal size, adding snacks. Choosing water as drink of choice and increasing purposeful exercise.

## 2022-06-07 NOTE — Progress Notes (Signed)
I,Sha'taria Tyson,acting as a Neurosurgeon for Jacky Kindle, FNP.,have documented all relevant documentation on the behalf of Jacky Kindle, FNP,as directed by  Jacky Kindle, FNP while in the presence of Jacky Kindle, FNP.   Established patient visit   Patient: Gail Hunter   DOB: [birth year]   62 y.o. Female  MRN: [id number] Visit Date: 06/07/2022  Today's healthcare provider: Jacky Kindle, FNP  Re Introduced to nurse practitioner role and practice setting.  All questions answered.  Discussed provider/patient relationship and expectations.  Subjective    HPI  Hypothyroid, follow-up  Lab Results  Component Value Date   TSH 1.840 12/03/2021   TSH 6.930 (H) 09/17/2021   TSH 10.500 (H) 08/27/2021   FREET4 1.15 09/17/2021   FREET4 1.67 11/27/2020   T4TOTAL 8.4 08/27/2021   T4TOTAL 13.3 (H) 03/23/2018    Wt Readings from Last 3 Encounters:  06/07/22 172 lb 6.4 oz (78.2 kg)  12/03/21 184 lb (83.5 kg)  11/27/20 183 lb 8 oz (83.2 kg)    She was last seen for hypothyroid 6 months ago.  Management since that visit includes Synthroid 125 mcg. She reports excellent compliance with treatment. She is not having side effects.   Symptoms: No change in energy level No constipation  No diarrhea No heat / cold intolerance  No nervousness No palpitations  No weight changes   ----------------------------------------------------------------------------------------- Lipid/Cholesterol, Follow-up  Last lipid panel Other pertinent labs  Lab Results  Component Value Date   CHOL 117 12/03/2021   HDL 31 (L) 12/03/2021   LDLCALC 51 12/03/2021   TRIG 215 (H) 12/03/2021   CHOLHDL 3.8 12/03/2021   Lab Results  Component Value Date   ALT 10 12/03/2021   AST 13 12/03/2021   PLT 366 12/03/2021   TSH 1.840 12/03/2021     She was last seen for this 6 months ago.  Management since that visit includes continue current treatment.  She reports excellent compliance with treatment. She is  not having side effects.  Symptoms: No chest pain No chest pressure/discomfort  No dyspnea No lower extremity edema  No numbness or tingling of extremity No orthopnea  No palpitations No paroxysmal nocturnal dyspnea  No speech difficulty No syncope   The ASCVD Risk score (Arnett DK, et al., 2019) failed to calculate for the following reasons:   The valid total cholesterol range is 130 to 320 mg/dL ---------------------------------------------------------------------------------------------------   Medications: Outpatient Medications Prior to Visit  Medication Sig   Cholecalciferol (VITAMIN D3) 5000 units TABS Take by mouth.   cyanocobalamin (VITAMIN B12) 1000 MCG tablet Take 1,000 mcg by mouth daily.   fluticasone (FLONASE) 50 MCG/ACT nasal spray Place into both nostrils daily.   gemfibrozil (LOPID) 600 MG tablet Take 1 tablet (600 mg total) by mouth 2 (two) times daily before a meal.   levothyroxine (SYNTHROID) 125 MCG tablet Take 1 tablet (125 mcg total) by mouth daily.   Magnesium 250 MG TABS Take by mouth.   metFORMIN (GLUCOPHAGE-XR) 500 MG 24 hr tablet Take 2 tablets (1,000 mg total) by mouth daily with breakfast.   Omega-3 Fatty Acids (FISH OIL) 1200 MG CAPS Take by mouth.   rosuvastatin (CRESTOR) 20 MG tablet Take 1 tablet (20 mg total) by mouth daily.   spironolactone (ALDACTONE) 50 MG tablet Take 1 tablet (50 mg total) by mouth daily.   [DISCONTINUED] Semaglutide-Weight Management (WEGOVY) 1 MG/0.5ML SOAJ Inject 1 mg into the skin once a week.   Multiple  Vitamin (MULTIVITAMINS PO) Take by mouth. (Patient not taking: Reported on 06/07/2022)   No facility-administered medications prior to visit.    Review of Systems  Last CBC Lab Results  Component Value Date   WBC 7.7 12/03/2021   HGB 13.1 12/03/2021   HCT 40.0 12/03/2021   MCV 90 12/03/2021   MCH 29.6 12/03/2021   RDW 13.0 12/03/2021   PLT 366 12/03/2021   Last metabolic panel Lab Results  Component Value Date    GLUCOSE 86 12/03/2021   NA 146 (H) 12/03/2021   K 4.9 12/03/2021   CL 107 (H) 12/03/2021   CO2 22 12/03/2021   BUN 17 12/03/2021   CREATININE 1.00 12/03/2021   EGFR 64 12/03/2021   CALCIUM 9.6 12/03/2021   PHOS 3.7 08/27/2021   PROT 7.3 12/03/2021   ALBUMIN 4.6 12/03/2021   LABGLOB 2.7 12/03/2021   AGRATIO 1.7 12/03/2021   BILITOT 0.4 12/03/2021   ALKPHOS 53 12/03/2021   AST 13 12/03/2021   ALT 10 12/03/2021   Last lipids Lab Results  Component Value Date   CHOL 117 12/03/2021   HDL 31 (L) 12/03/2021   LDLCALC 51 12/03/2021   TRIG 215 (H) 12/03/2021   CHOLHDL 3.8 12/03/2021   Last hemoglobin A1c Lab Results  Component Value Date   HGBA1C 5.3 12/03/2021   Last thyroid functions Lab Results  Component Value Date   TSH 1.840 12/03/2021   T4TOTAL 8.4 08/27/2021   Last vitamin D Lab Results  Component Value Date   VD25OH 40.7 12/03/2021   Last vitamin B12 and Folate Lab Results  Component Value Date   VITAMINB12 757 12/03/2021       Objective    BP 134/82 (BP Location: Left Arm, Patient Position: Sitting, Cuff Size: Normal)   Pulse 86   Ht 5' (1.524 m)   Wt 172 lb 6.4 oz (78.2 kg)   LMP 03/17/2016   SpO2 95%   BMI 33.67 kg/m   BP Readings from Last 3 Encounters:  06/07/22 134/82  12/03/21 (!) 144/83  11/27/20 139/72   Wt Readings from Last 3 Encounters:  06/07/22 172 lb 6.4 oz (78.2 kg)  12/03/21 184 lb (83.5 kg)  11/27/20 183 lb 8 oz (83.2 kg)   SpO2 Readings from Last 3 Encounters:  06/07/22 95%  12/03/21 95%  11/27/20 94%   Physical Exam Vitals and nursing note reviewed.  Constitutional:      General: She is not in acute distress.    Appearance: Normal appearance. She is obese. She is not ill-appearing, toxic-appearing or diaphoretic.  HENT:     Head: Normocephalic and atraumatic.  Cardiovascular:     Rate and Rhythm: Normal rate and regular rhythm.     Pulses: Normal pulses.     Heart sounds: Normal heart sounds. No murmur  heard.    No friction rub. No gallop.  Pulmonary:     Effort: Pulmonary effort is normal. No respiratory distress.     Breath sounds: Normal breath sounds. No stridor. No wheezing, rhonchi or rales.  Chest:     Chest wall: No tenderness.  Abdominal:     General: Bowel sounds are normal.     Palpations: Abdomen is soft.  Musculoskeletal:        General: No swelling, tenderness, deformity or signs of injury. Normal range of motion.     Right lower leg: No edema.     Left lower leg: No edema.  Skin:    General: Skin is warm  and dry.     Capillary Refill: Capillary refill takes less than 2 seconds.     Coloration: Skin is not jaundiced or pale.     Findings: No bruising, erythema, lesion or rash.  Neurological:     General: No focal deficit present.     Mental Status: She is alert and oriented to person, place, and time. Mental status is at baseline.     Cranial Nerves: No cranial nerve deficit.     Sensory: No sensory deficit.     Motor: No weakness.     Coordination: Coordination normal.  Psychiatric:        Mood and Affect: Mood normal.        Behavior: Behavior normal.        Thought Content: Thought content normal.        Judgment: Judgment normal.     No results found for any visits on 06/07/22.  Assessment & Plan     Problem List Items Addressed This Visit       Endocrine   Hypothyroidism (Chronic)    Chronic, stable Remains on levothyroxine at 125 mcg No complaints at this time; defers repeat labs Continue to monitor s/s        Other   Hypercholesterolemia with hypertriglyceridemia - Primary (Chronic)    Chronic, previously improved with Total at 117 and LDL at 51. Trigs remain elevated previously; however, non fasting labs Declines repeat LP recommend diet low in saturated fat and regular exercise - 30 min at least 5 times per week Remains on crestor 20 mg daily       Avitaminosis D    Chronic, on Vit D 1000 IU daily; defers repeat labs at this  time Continue to monitor s/s       B12 deficiency    Chronic, remains on OTC supplement Declines labs at this time Continue to monitor energy levels/fatigue       Class 1 obesity due to excess calories without serious comorbidity with body mass index (BMI) of 33.0 to 33.9 in adult    Chronic obesity; with 10-15 lbs weight loss on Wegovy 1 mg; now notes that Reginal Lutes is not covered and request for paper Rx for Wegovy to be fulfilled at a compounding pharmacy  Continue to monitor  Continue to recommend balanced, lower carb meals. Smaller meal size, adding snacks. Choosing water as drink of choice and increasing purposeful exercise.       Relevant Medications   Semaglutide-Weight Management 1.7 MG/0.75ML SOAJ   Return in about 6 months (around 12/08/2022) for annual examination.     Leilani Merl, FNP, have reviewed all documentation for this visit. The documentation on 06/07/22 for the exam, diagnosis, procedures, and orders are all accurate and complete.  Jacky Kindle, FNP  [facility] [phone] (phone) [phone] (fax)  [facility]

## 2022-06-07 NOTE — Assessment & Plan Note (Signed)
Chronic, stable Remains on levothyroxine at 125 mcg No complaints at this time; defers repeat labs Continue to monitor s/s

## 2022-06-07 NOTE — Assessment & Plan Note (Signed)
Chronic, on Vit D 1000 IU daily; defers repeat labs at this time Continue to monitor s/s

## 2022-06-07 NOTE — Assessment & Plan Note (Signed)
Chronic, remains on OTC supplement Declines labs at this time Continue to monitor energy levels/fatigue

## 2022-06-07 NOTE — Assessment & Plan Note (Signed)
Chronic, previously improved with Total at 117 and LDL at 51. Trigs remain elevated previously; however, non fasting labs Declines repeat LP recommend diet low in saturated fat and regular exercise - 30 min at least 5 times per week Remains on crestor 20 mg daily

## 2022-08-25 ENCOUNTER — Other Ambulatory Visit: Payer: Self-pay

## 2022-08-25 ENCOUNTER — Other Ambulatory Visit: Payer: Self-pay | Admitting: Family Medicine

## 2022-08-25 DIAGNOSIS — E782 Mixed hyperlipidemia: Secondary | ICD-10-CM

## 2022-08-26 ENCOUNTER — Other Ambulatory Visit: Payer: Self-pay

## 2022-08-26 MED FILL — Gemfibrozil Tab 600 MG: ORAL | 90 days supply | Qty: 180 | Fill #0 | Status: AC

## 2022-08-26 NOTE — Telephone Encounter (Signed)
Requested Prescriptions  Pending Prescriptions Disp Refills   gemfibrozil (LOPID) 600 MG tablet 180 tablet 0    Sig: Take 1 tablet (600 mg total) by mouth 2 (two) times daily before a meal.     Cardiovascular:  Antilipid - Fibric Acid Derivatives Failed - 08/25/2022  3:51 PM      Failed - Lipid Panel in normal range within the last 12 months    Cholesterol, Total  Date Value Ref Range Status  12/03/2021 117 100 - 199 mg/dL Final   LDL Chol Calc (NIH)  Date Value Ref Range Status  12/03/2021 51 0 - 99 mg/dL Final   HDL  Date Value Ref Range Status  12/03/2021 31 (L) >39 mg/dL Final   Triglycerides  Date Value Ref Range Status  12/03/2021 215 (H) 0 - 149 mg/dL Final         Passed - ALT in normal range and within 360 days    ALT  Date Value Ref Range Status  12/03/2021 10 0 - 32 IU/L Final         Passed - AST in normal range and within 360 days    AST  Date Value Ref Range Status  12/03/2021 13 0 - 40 IU/L Final         Passed - Cr in normal range and within 360 days    Creatinine, Ser  Date Value Ref Range Status  12/03/2021 1.00 0.57 - 1.00 mg/dL Final         Passed - HGB in normal range and within 360 days    Hemoglobin  Date Value Ref Range Status  12/03/2021 13.1 11.1 - 15.9 g/dL Final         Passed - HCT in normal range and within 360 days    Hematocrit  Date Value Ref Range Status  12/03/2021 40.0 34.0 - 46.6 % Final         Passed - PLT in normal range and within 360 days    Platelets  Date Value Ref Range Status  12/03/2021 366 150 - 450 x10E3/uL Final         Passed - WBC in normal range and within 360 days    WBC  Date Value Ref Range Status  12/03/2021 7.7 3.4 - 10.8 x10E3/uL Final         Passed - eGFR is 30 or above and within 360 days    GFR calc Af Amer  Date Value Ref Range Status  11/22/2019 68 >59 mL/min/1.73 Final    Comment:    **In accordance with recommendations from the NKF-ASN Task force,**   [facility] is in the process  of updating its eGFR calculation to the   2021 CKD-EPI creatinine equation that estimates kidney function   without a race variable.    GFR calc non Af Amer  Date Value Ref Range Status  11/22/2019 59 (L) >59 mL/min/1.73 Final   eGFR  Date Value Ref Range Status  12/03/2021 64 >59 mL/min/1.73 Final         Passed - Valid encounter within last 12 months    Recent Outpatient Visits           2 months ago Hypercholesterolemia with hypertriglyceridemia   [facility] Jacky Kindle, FNP   8 months ago Annual physical exam   [facility] Jacky Kindle, FNP   1 year ago Annual physical exam   [facility]  Jacky Kindle, FNP   2 years ago Annual physical exam   [facility] [place], [place]andra [place], [place]   3 years ago Annual physical exam   [facility] [place], [place], [place]       Future Appointments             In 3 months Suzie Portela, Daryl Eastern, FNP [facility], [facility]

## 2022-09-29 DIAGNOSIS — H25813 Combined forms of age-related cataract, bilateral: Secondary | ICD-10-CM | POA: Diagnosis not present

## 2022-09-29 DIAGNOSIS — H524 Presbyopia: Secondary | ICD-10-CM | POA: Diagnosis not present

## 2022-12-07 ENCOUNTER — Encounter: Payer: Self-pay | Admitting: Family Medicine

## 2022-12-07 ENCOUNTER — Ambulatory Visit (INDEPENDENT_AMBULATORY_CARE_PROVIDER_SITE_OTHER): Payer: Commercial Managed Care - PPO | Admitting: Family Medicine

## 2022-12-07 ENCOUNTER — Other Ambulatory Visit (HOSPITAL_COMMUNITY)
Admission: RE | Admit: 2022-12-07 | Discharge: 2022-12-07 | Disposition: A | Discharge status: Home / Self Care | Payer: Commercial Managed Care - PPO | Source: Ambulatory Visit | Attending: Family Medicine | Admitting: Family Medicine

## 2022-12-07 VITALS — BP 134/72 | HR 82 | Resp 16 | Wt 173.0 lb

## 2022-12-07 DIAGNOSIS — Z6833 Body mass index (BMI) 33.0-33.9, adult: Secondary | ICD-10-CM | POA: Diagnosis not present

## 2022-12-07 DIAGNOSIS — Z1231 Encounter for screening mammogram for malignant neoplasm of breast: Secondary | ICD-10-CM | POA: Diagnosis not present

## 2022-12-07 DIAGNOSIS — E66811 Obesity, class 1: Secondary | ICD-10-CM | POA: Diagnosis not present

## 2022-12-07 DIAGNOSIS — Z124 Encounter for screening for malignant neoplasm of cervix: Secondary | ICD-10-CM | POA: Insufficient documentation

## 2022-12-07 DIAGNOSIS — Z0000 Encounter for general adult medical examination without abnormal findings: Secondary | ICD-10-CM | POA: Diagnosis not present

## 2022-12-07 DIAGNOSIS — E782 Mixed hyperlipidemia: Secondary | ICD-10-CM

## 2022-12-07 DIAGNOSIS — E6609 Other obesity due to excess calories: Secondary | ICD-10-CM | POA: Diagnosis not present

## 2022-12-07 DIAGNOSIS — E039 Hypothyroidism, unspecified: Secondary | ICD-10-CM | POA: Diagnosis not present

## 2022-12-07 DIAGNOSIS — I10 Essential (primary) hypertension: Secondary | ICD-10-CM | POA: Insufficient documentation

## 2022-12-07 NOTE — Progress Notes (Signed)
Complete physical exam   Patient: Gail Hunter   DOB: Oct 16, 1960   62 y.o. Female  MRN: [id number] Visit Date: 12/07/2022  Today's healthcare provider: Jacky Kindle, FNP  Re Introduced to nurse practitioner role and practice setting.  All questions answered.  Discussed provider/patient relationship and expectations.  Chief Complaint  Patient presents with   Annual Exam   Subjective    Gail Hunter is a 62 y.o. female who presents today for a complete physical exam.  She reports consuming a general diet. The patient does not participate in regular exercise at present. She generally feels fairly well. She reports sleeping fairly well. She does not have additional problems to discuss today.   HPI   The patient, a woman in her early sixties, presents for a routine check-up. She reports no specific health complaints. She admits to not exercising much recently due to her busy schedule and fatigue. She denies following any specific diet. She has been taking Lopid, Synthroid, and metformin. She also uses Zyrtec and Flonase regularly for sinus issues. She has a history of elevated fat levels in her cholesterol but reports that her LDL was at goal during her last check-up. Her vitamin D and B12 levels were also within normal limits. She has a history of borderline prediabetes, but her A1c levels have been stable for years. She is due for a Pap smear and a mammogram. She denies any concerns for sexually transmitted infections.  Past Medical History:  Diagnosis Date   Allergy    Anemia    History of chicken pox    History of measles    Thyroid disease    Past Surgical History:  Procedure Laterality Date   CESAREAN SECTION     CHOLECYSTECTOMY  02/2002   LAPAROSCOPIC ABDOMINAL EXPLORATION     Social History   Socioeconomic History   Marital status: Married    Spouse name: Not on file   Number of children: 1   Years of education: Not on file   Highest education level: Not on file   Occupational History   Not on file  Tobacco Use   Smoking status: Former    Current packs/day: 0.00    Average packs/day: 0.3 packs/day for 2.0 years (0.5 ttl pk-yrs)    Types: Cigarettes    Start date: 01/16/1998    Quit date: 01/17/2000    Years since quitting: 22.9   Smokeless tobacco: Never  Substance and Sexual Activity   Alcohol use: No   Drug use: No   Sexual activity: Not on file  Other Topics Concern   Not on file  Social History Narrative   Not on file   Social Determinants of Health   Financial Resource Strain: Not on file  Food Insecurity: Not on file  Transportation Needs: Not on file  Physical Activity: Not on file  Stress: Not on file  Social Connections: Not on file  Intimate Partner Violence: Not on file   Family Status  Relation Name Status   Mother  Alive   Father  Deceased       died Mar 08, 2016   Sister  Alive   Daughter  Alive   Brother  Alive   Neg Hx  (Not Specified)  No partnership data on file   Family History  Problem Relation Age of Onset   Alzheimer's disease Mother    Thyroid disease Mother    Alzheimer's disease Father    Diabetes Sister  Hyperlipidemia Brother    Breast cancer Neg Hx    No Known Allergies  Patient Care Team: Jacky Kindle, FNP as PCP - General (Family Medicine)   Medications: Outpatient Medications Prior to Visit  Medication Sig   Cholecalciferol (VITAMIN D3) 5000 units TABS Take by mouth.   fluticasone (FLONASE) 50 MCG/ACT nasal spray Place into both nostrils daily.   gemfibrozil (LOPID) 600 MG tablet Take 1 tablet (600 mg total) by mouth 2 (two) times daily before a meal.   levothyroxine (SYNTHROID) 125 MCG tablet Take 1 tablet (125 mcg total) by mouth daily.   metFORMIN (GLUCOPHAGE-XR) 500 MG 24 hr tablet Take 2 tablets (1,000 mg total) by mouth daily with breakfast.   rosuvastatin (CRESTOR) 20 MG tablet Take 1 tablet (20 mg total) by mouth daily.   Semaglutide-Weight Management 1.7 MG/0.75ML SOAJ  Inject 1.7 mg into the skin once a week.   spironolactone (ALDACTONE) 50 MG tablet Take 1 tablet (50 mg total) by mouth daily.   [DISCONTINUED] cyanocobalamin (VITAMIN B12) 1000 MCG tablet Take 1,000 mcg by mouth daily.   [DISCONTINUED] Magnesium 250 MG TABS Take by mouth.   [DISCONTINUED] Multiple Vitamin (MULTIVITAMINS PO) Take by mouth. (Patient not taking: Reported on 06/07/2022)   [DISCONTINUED] Omega-3 Fatty Acids (FISH OIL) 1200 MG CAPS Take by mouth.   No facility-administered medications prior to visit.    Objective    BP 134/72 (BP Location: Left Arm, Patient Position: Sitting, Cuff Size: Normal)   Pulse 82   Resp 16   Wt 173 lb (78.5 kg)   LMP 03/17/2016   SpO2 98%   BMI 33.79 kg/m   Physical Exam Vitals and nursing note reviewed.  Constitutional:      General: She is awake. She is not in acute distress.    Appearance: Normal appearance. She is well-developed and well-groomed. She is obese. She is not ill-appearing, toxic-appearing or diaphoretic.  HENT:     Head: Normocephalic and atraumatic.     Jaw: There is normal jaw occlusion. No trismus, tenderness, swelling or pain on movement.     Right Ear: Hearing, tympanic membrane, ear canal and external ear normal. There is no impacted cerumen.     Left Ear: Hearing, tympanic membrane, ear canal and external ear normal. There is no impacted cerumen.     Nose: Nose normal. No congestion or rhinorrhea.     Right Turbinates: Not enlarged, swollen or pale.     Left Turbinates: Not enlarged, swollen or pale.     Right Sinus: No maxillary sinus tenderness or frontal sinus tenderness.     Left Sinus: No maxillary sinus tenderness or frontal sinus tenderness.     Mouth/Throat:     Lips: Pink.     Mouth: Mucous membranes are moist. No injury.     Tongue: No lesions.     Pharynx: Oropharynx is clear. Uvula midline. No pharyngeal swelling, oropharyngeal exudate, posterior oropharyngeal erythema or uvula swelling.     Tonsils: No  tonsillar exudate or tonsillar abscesses.  Eyes:     General: Lids are normal. Lids are everted, no foreign bodies appreciated. Vision grossly intact. Gaze aligned appropriately. No allergic shiner or visual field deficit.       Right eye: No discharge.        Left eye: No discharge.     Extraocular Movements: Extraocular movements intact.     Conjunctiva/sclera: Conjunctivae normal.     Right eye: Right conjunctiva is not injected. No exudate.  Left eye: Left conjunctiva is not injected. No exudate.    Pupils: Pupils are equal, round, and reactive to light.  Neck:     Thyroid: No thyroid mass, thyromegaly or thyroid tenderness.     Vascular: No carotid bruit.     Trachea: Trachea normal.  Cardiovascular:     Rate and Rhythm: Normal rate and regular rhythm.     Pulses: Normal pulses.          Carotid pulses are 2+ on the right side and 2+ on the left side.      Radial pulses are 2+ on the right side and 2+ on the left side.       Dorsalis pedis pulses are 2+ on the right side and 2+ on the left side.       Posterior tibial pulses are 2+ on the right side and 2+ on the left side.     Heart sounds: Normal heart sounds, S1 normal and S2 normal. No murmur heard.    No friction rub. No gallop.  Pulmonary:     Effort: Pulmonary effort is normal. No respiratory distress.     Breath sounds: Normal breath sounds and air entry. No stridor. No wheezing, rhonchi or rales.  Chest:     Chest wall: No tenderness.  Abdominal:     General: Abdomen is flat. Bowel sounds are normal. There is no distension.     Palpations: Abdomen is soft. There is no mass.     Tenderness: There is no abdominal tenderness. There is no right CVA tenderness, left CVA tenderness, guarding or rebound.     Hernia: No hernia is present.  Genitourinary:    Comments: PAP completed Musculoskeletal:        General: No swelling, tenderness, deformity or signs of injury. Normal range of motion.     Cervical back: Full passive  range of motion without pain, normal range of motion and neck supple. No edema, rigidity or tenderness. No muscular tenderness.     Right lower leg: No edema.     Left lower leg: No edema.  Lymphadenopathy:     Cervical: No cervical adenopathy.     Right cervical: No superficial, deep or posterior cervical adenopathy.    Left cervical: No superficial, deep or posterior cervical adenopathy.  Skin:    General: Skin is warm and dry.     Capillary Refill: Capillary refill takes less than 2 seconds.     Coloration: Skin is not jaundiced or pale.     Findings: No bruising, erythema, lesion or rash.  Neurological:     General: No focal deficit present.     Mental Status: She is alert and oriented to person, place, and time. Mental status is at baseline.     GCS: GCS eye subscore is 4. GCS verbal subscore is 5. GCS motor subscore is 6.     Sensory: Sensation is intact. No sensory deficit.     Motor: Motor function is intact. No weakness.     Coordination: Coordination is intact. Coordination normal.     Gait: Gait is intact. Gait normal.  Psychiatric:        Attention and Perception: Attention and perception normal.        Mood and Affect: Mood and affect normal.        Speech: Speech normal.        Behavior: Behavior normal. Behavior is cooperative.        Thought Content: Thought content normal.  Cognition and Memory: Cognition and memory normal.        Judgment: Judgment normal.     Last depression screening scores    12/07/2022    9:14 AM 06/07/2022    3:48 PM 12/03/2021    9:19 AM  PHQ 2/9 Scores  PHQ - 2 Score 0 0 0  PHQ- 9 Score  1 0   Last fall risk screening    12/07/2022    9:14 AM  Fall Risk   Falls in the past year? 0  Number falls in past yr: 0  Injury with Fall? 0  Risk for fall due to : No Fall Risks   Last Audit-C alcohol use screening    06/07/2022    3:48 PM  Alcohol Use Disorder Test (AUDIT)  1. How often do you have a drink containing alcohol? 0   2. How many drinks containing alcohol do you have on a typical day when you are drinking? 0  3. How often do you have six or more drinks on one occasion? 0  AUDIT-C Score 0   A score of 3 or more in women, and 4 or more in men indicates increased risk for alcohol abuse, EXCEPT if all of the points are from question 1   No results found for any visits on 12/07/22.  Assessment & Plan    Routine Health Maintenance and Physical Exam  Exercise Activities and Dietary recommendations  Goals   None     Immunization History  Administered Date(s) Administered   Influenza Inj Mdck Quad With Preservative 11/12/2019   Influenza-Unspecified 10/11/2016, 10/10/2017, 10/25/2018, 10/23/2020, 10/26/2021, 10/27/2022   PFIZER(Purple Top)SARS-COV-2 Vaccination 02/04/2019, 02/26/2019, 11/12/2019   Td 09/13/2016   Tdap 09/13/2016   Zoster Recombinant(Shingrix) 11/27/2020, 12/03/2021    Health Maintenance  Topic Date Due   COVID-19 Vaccine (4 - 2023-24 season) 09/18/2022   Cervical Cancer Screening (HPV/Pap Cotest)  11/04/2022   MAMMOGRAM  11/11/2023   Colonoscopy  04/01/2026   DTaP/Tdap/Td (3 - Td or Tdap) 09/14/2026   INFLUENZA VACCINE  Completed   Hepatitis C Screening  Completed   HIV Screening  Completed   Zoster Vaccines- Shingrix  Completed   HPV VACCINES  Aged Out    Discussed health benefits of physical activity, and encouraged her to engage in regular exercise appropriate for her age and condition.  Problem List Items Addressed This Visit       Cardiovascular and Mediastinum   Primary hypertension     Endocrine   Hypothyroidism (Chronic)     Other   Hypercholesterolemia with hypertriglyceridemia (Chronic)   Annual physical exam - Primary   Relevant Orders   CBC with Differential/Platelet   Comprehensive metabolic panel   Lipid panel   TSH   Class 1 obesity due to excess calories without serious comorbidity with body mass index (BMI) of 33.0 to 33.9 in adult   Screening  for cervical cancer   Relevant Orders   Cytology - PAP   Screening mammogram for breast cancer   Relevant Orders   MM 3D SCREENING MAMMOGRAM BILATERAL BREAST   Hyperlipidemia Elevated triglycerides on Lopid. LDL at goal. -Continue Lopid 600 mg -Check lipid panel today.  Hypothyroidism Stable on Synthroid 125 mcg -Continue Synthroid. -Check TSH today.  Metabolic Syndrome Stable on Metformin 1000 mg BID -Continue Metformin.  General Health Maintenance -Perform Pap smear with HPV testing today. Discussed that this could be the last Pap smear if patient desires. -Recommend scheduling mammogram as it is  due. -Check CBC and CMP today. -Colon cancer screening not due until 2028.  No follow-ups on file.    Leilani Merl, FNP, have reviewed all documentation for this visit. The documentation on 12/07/22 for the exam, diagnosis, procedures, and orders are all accurate and complete.  Jacky Kindle, FNP  [facility] [phone] (phone) [phone] (fax)  [facility]

## 2022-12-08 LAB — LIPID PANEL
Chol/HDL Ratio: 4.2 ratio (ref 0.0–4.4)
Cholesterol, Total: 130 mg/dL (ref 100–199)
HDL: 31 mg/dL — ABNORMAL LOW (ref >39)
LDL Chol Calc (NIH): 58 mg/dL (ref 0–99)
Triglycerides: 257 mg/dL — ABNORMAL HIGH (ref 0–149)
VLDL Cholesterol Cal: 41 mg/dL — ABNORMAL HIGH (ref 5–40)

## 2022-12-08 LAB — COMPREHENSIVE METABOLIC PANEL
ALT: 11 [IU]/L (ref 0–32)
AST: 17 [IU]/L (ref 0–40)
Albumin: 4.5 g/dL (ref 3.9–4.9)
Alkaline Phosphatase: 68 [IU]/L (ref 44–121)
BUN/Creatinine Ratio: 14 (ref 12–28)
BUN: 16 mg/dL (ref 8–27)
Bilirubin Total: 0.3 mg/dL (ref 0.0–1.2)
CO2: 22 mmol/L (ref 20–29)
Calcium: 9.9 mg/dL (ref 8.7–10.3)
Chloride: 106 mmol/L (ref 96–106)
Creatinine, Ser: 1.13 mg/dL — ABNORMAL HIGH (ref 0.57–1.00)
Globulin, Total: 2.7 g/dL (ref 1.5–4.5)
Glucose: 92 mg/dL (ref 70–99)
Potassium: 4.7 mmol/L (ref 3.5–5.2)
Sodium: 144 mmol/L (ref 134–144)
Total Protein: 7.2 g/dL (ref 6.0–8.5)
eGFR: 55 mL/min/{1.73_m2} — ABNORMAL LOW (ref >59)

## 2022-12-08 LAB — CBC WITH DIFFERENTIAL/PLATELET
Basophils Absolute: 0.1 10*3/uL (ref 0.0–0.2)
Basos: 1 %
EOS (ABSOLUTE): 0.2 10*3/uL (ref 0.0–0.4)
Eos: 2 %
Hematocrit: 44.5 % (ref 34.0–46.6)
Hemoglobin: 14.4 g/dL (ref 11.1–15.9)
Immature Grans (Abs): 0 10*3/uL (ref 0.0–0.1)
Immature Granulocytes: 0 %
Lymphocytes Absolute: 1.6 10*3/uL (ref 0.7–3.1)
Lymphs: 21 %
MCH: 28.3 pg (ref 26.6–33.0)
MCHC: 32.4 g/dL (ref 31.5–35.7)
MCV: 87 fL (ref 79–97)
Monocytes Absolute: 0.4 10*3/uL (ref 0.1–0.9)
Monocytes: 6 %
Neutrophils Absolute: 5.2 10*3/uL (ref 1.4–7.0)
Neutrophils: 70 %
Platelets: 368 10*3/uL (ref 150–450)
RBC: 5.09 x10E6/uL (ref 3.77–5.28)
RDW: 13.1 % (ref 11.7–15.4)
WBC: 7.5 10*3/uL (ref 3.4–10.8)

## 2022-12-08 LAB — TSH: TSH: 6.01 u[IU]/mL — ABNORMAL HIGH (ref 0.450–4.500)

## 2022-12-09 ENCOUNTER — Encounter: Payer: Self-pay | Admitting: Family Medicine

## 2022-12-09 DIAGNOSIS — E782 Mixed hyperlipidemia: Secondary | ICD-10-CM

## 2022-12-09 DIAGNOSIS — I10 Essential (primary) hypertension: Secondary | ICD-10-CM

## 2022-12-09 DIAGNOSIS — E039 Hypothyroidism, unspecified: Secondary | ICD-10-CM

## 2022-12-09 LAB — CYTOLOGY - PAP
Comment: NEGATIVE
Diagnosis: NEGATIVE
High risk HPV: NEGATIVE

## 2022-12-12 ENCOUNTER — Other Ambulatory Visit: Payer: Self-pay

## 2022-12-12 MED ORDER — LEVOTHYROXINE SODIUM 25 MCG PO TABS
25.0000 ug | ORAL_TABLET | Freq: Every day | ORAL | 1 refills | Status: DC
  Filled 2022-12-12: qty 90, 90d supply, fill #0
  Filled 2023-04-18: qty 90, 90d supply, fill #1

## 2022-12-12 MED ORDER — LISINOPRIL 10 MG PO TABS
10.0000 mg | ORAL_TABLET | Freq: Every day | ORAL | 1 refills | Status: DC
  Filled 2022-12-12: qty 90, 90d supply, fill #0

## 2022-12-12 MED ORDER — ROSUVASTATIN CALCIUM 40 MG PO TABS
40.0000 mg | ORAL_TABLET | Freq: Every day | ORAL | 1 refills | Status: DC
  Filled 2022-12-12: qty 90, 90d supply, fill #0

## 2022-12-12 MED ORDER — LEVOTHYROXINE SODIUM 112 MCG PO TABS
112.0000 ug | ORAL_TABLET | Freq: Every day | ORAL | 3 refills | Status: DC
  Filled 2022-12-12: qty 90, 90d supply, fill #0
  Filled 2023-04-18: qty 90, 90d supply, fill #1

## 2023-04-18 ENCOUNTER — Other Ambulatory Visit: Payer: Self-pay

## 2023-06-28 ENCOUNTER — Ambulatory Visit: Payer: Commercial Managed Care - PPO | Admitting: Family Medicine

## 2023-07-14 ENCOUNTER — Other Ambulatory Visit: Payer: Self-pay

## 2023-07-14 DIAGNOSIS — E038 Other specified hypothyroidism: Secondary | ICD-10-CM

## 2023-07-14 NOTE — Progress Notes (Signed)
Pt presents today to complete labs for outside provider. Gail Hunter

## 2023-07-15 LAB — CMP12+LP+TP+TSH+6AC+CBC/D/PLT
ALT: 12 IU/L (ref 0–32)
AST: 18 IU/L (ref 0–40)
Albumin: 4.3 g/dL (ref 3.9–4.9)
Alkaline Phosphatase: 68 IU/L (ref 44–121)
BUN/Creatinine Ratio: 15 (ref 12–28)
BUN: 15 mg/dL (ref 8–27)
Basophils Absolute: 0.1 10*3/uL (ref 0.0–0.2)
Basos: 1 %
Bilirubin Total: 0.2 mg/dL (ref 0.0–1.2)
Calcium: 9.3 mg/dL (ref 8.7–10.3)
Chloride: 106 mmol/L (ref 96–106)
Chol/HDL Ratio: 4.3 ratio (ref 0.0–4.4)
Cholesterol, Total: 132 mg/dL (ref 100–199)
Creatinine, Ser: 1 mg/dL (ref 0.57–1.00)
EOS (ABSOLUTE): 0.2 10*3/uL (ref 0.0–0.4)
Eos: 3 %
Estimated CHD Risk: 1 times avg. (ref 0.0–1.0)
Free Thyroxine Index: 2.4 (ref 1.2–4.9)
GGT: 8 IU/L (ref 0–60)
Globulin, Total: 2.6 g/dL (ref 1.5–4.5)
Glucose: 114 mg/dL — ABNORMAL HIGH (ref 70–99)
HDL: 31 mg/dL — ABNORMAL LOW (ref >39)
Hematocrit: 35.9 % (ref 34.0–46.6)
Hemoglobin: 11.3 g/dL (ref 11.1–15.9)
Immature Grans (Abs): 0 10*3/uL (ref 0.0–0.1)
Immature Granulocytes: 0 %
Iron: 36 ug/dL (ref 27–139)
LDH: 189 IU/L (ref 119–226)
LDL Chol Calc (NIH): 66 mg/dL (ref 0–99)
Lymphocytes Absolute: 2.3 10*3/uL (ref 0.7–3.1)
Lymphs: 36 %
MCH: 24.7 pg — ABNORMAL LOW (ref 26.6–33.0)
MCHC: 31.5 g/dL (ref 31.5–35.7)
MCV: 79 fL (ref 79–97)
Monocytes Absolute: 0.5 10*3/uL (ref 0.1–0.9)
Monocytes: 8 %
Neutrophils Absolute: 3.3 10*3/uL (ref 1.4–7.0)
Neutrophils: 52 %
Phosphorus: 3.7 mg/dL (ref 3.0–4.3)
Platelets: 345 10*3/uL (ref 150–450)
Potassium: 4.3 mmol/L (ref 3.5–5.2)
RBC: 4.57 x10E6/uL (ref 3.77–5.28)
RDW: 14.8 % (ref 11.7–15.4)
Sodium: 141 mmol/L (ref 134–144)
T3 Uptake Ratio: 26 % (ref 24–39)
T4, Total: 9.4 ug/dL (ref 4.5–12.0)
TSH: 2.3 u[IU]/mL (ref 0.450–4.500)
Total Protein: 6.9 g/dL (ref 6.0–8.5)
Triglycerides: 208 mg/dL — ABNORMAL HIGH (ref 0–149)
Uric Acid: 5.2 mg/dL (ref 3.0–7.2)
VLDL Cholesterol Cal: 35 mg/dL (ref 5–40)
WBC: 6.4 10*3/uL (ref 3.4–10.8)
eGFR: 64 mL/min/{1.73_m2} (ref >59)

## 2023-07-19 ENCOUNTER — Other Ambulatory Visit: Payer: Self-pay

## 2023-07-19 ENCOUNTER — Ambulatory Visit: Payer: Self-pay | Admitting: Family Medicine

## 2023-07-19 ENCOUNTER — Encounter: Payer: Self-pay | Admitting: Family Medicine

## 2023-07-19 ENCOUNTER — Ambulatory Visit: Payer: Commercial Managed Care - PPO | Admitting: Family Medicine

## 2023-07-19 VITALS — BP 156/72 | HR 79 | Temp 98.0°F | Ht 60.0 in | Wt 183.0 lb

## 2023-07-19 DIAGNOSIS — E6609 Other obesity due to excess calories: Secondary | ICD-10-CM

## 2023-07-19 DIAGNOSIS — R739 Hyperglycemia, unspecified: Secondary | ICD-10-CM

## 2023-07-19 DIAGNOSIS — Z1231 Encounter for screening mammogram for malignant neoplasm of breast: Secondary | ICD-10-CM | POA: Diagnosis not present

## 2023-07-19 DIAGNOSIS — I10 Essential (primary) hypertension: Secondary | ICD-10-CM

## 2023-07-19 DIAGNOSIS — Z6833 Body mass index (BMI) 33.0-33.9, adult: Secondary | ICD-10-CM

## 2023-07-19 DIAGNOSIS — E782 Mixed hyperlipidemia: Secondary | ICD-10-CM | POA: Diagnosis not present

## 2023-07-19 DIAGNOSIS — E039 Hypothyroidism, unspecified: Secondary | ICD-10-CM

## 2023-07-19 DIAGNOSIS — E66811 Obesity, class 1: Secondary | ICD-10-CM

## 2023-07-19 LAB — BAYER DCA HB A1C WAIVED: HB A1C (BAYER DCA - WAIVED): 5.8 % — ABNORMAL HIGH (ref 4.8–5.6)

## 2023-07-19 MED ORDER — WEGOVY 0.25 MG/0.5ML ~~LOC~~ SOAJ
0.2500 mg | SUBCUTANEOUS | 0 refills | Status: DC
  Filled 2023-07-19: qty 2, 28d supply, fill #0

## 2023-07-19 MED ORDER — ROSUVASTATIN CALCIUM 40 MG PO TABS
40.0000 mg | ORAL_TABLET | Freq: Every day | ORAL | 1 refills | Status: DC
  Filled 2023-07-19: qty 90, 90d supply, fill #0
  Filled 2023-11-15: qty 90, 90d supply, fill #1

## 2023-07-19 MED ORDER — LEVOTHYROXINE SODIUM 137 MCG PO TABS
137.0000 ug | ORAL_TABLET | Freq: Every day | ORAL | 3 refills | Status: DC
  Filled 2023-07-19: qty 90, 90d supply, fill #0
  Filled 2023-11-15: qty 90, 90d supply, fill #1

## 2023-07-19 MED ORDER — GEMFIBROZIL 600 MG PO TABS
600.0000 mg | ORAL_TABLET | Freq: Two times a day (BID) | ORAL | 1 refills | Status: DC
Start: 2023-07-19 — End: 2023-12-11
  Filled 2023-07-19: qty 180, 90d supply, fill #0

## 2023-07-19 MED ORDER — WEGOVY 0.5 MG/0.5ML ~~LOC~~ SOAJ
0.5000 mg | SUBCUTANEOUS | 1 refills | Status: DC
  Filled 2023-07-19: qty 2, fill #0
  Filled 2023-09-11: qty 2, 28d supply, fill #0

## 2023-07-19 NOTE — Progress Notes (Unsigned)
 BP (!) 156/72   Pulse 79   Temp 98 F (36.7 C) (Oral)   Ht 5' (1.524 m)   Wt 183 lb (83 kg)   LMP 03/17/2016   SpO2 96%   BMI 35.74 kg/m    Subjective:    Patient ID: Gail Hunter, female    DOB: 1960/03/12, 63 y.o.   MRN: [id number]  HPI: Gail Hunter is a 63 y.o. female  Chief Complaint  Patient presents with   Establish Care   OBESITY Duration: chronic Previous attempts at weight loss: yes- diet, exercise, portion control wegovy  Complications of obesity: IFG, HTN, HLD Peak weight: 184lbs Weight loss goal: to be healthy Weight loss to date: 1lb Requesting obesity pharmacotherapy: yes Current weight loss supplements/medications: no Previous weight loss supplements/meds: yes  HYPERTENSION / HYPERLIPIDEMIA Satisfied with current treatment? yes Duration of hypertension: chronic BP monitoring frequency: not checking BP medication side effects: no Past BP meds: spironalactone Duration of hyperlipidemia: chronic Cholesterol medication side effects: no Cholesterol supplements: fish oil Past cholesterol medications: gemfibrozil  Medication compliance: excellent compliance Aspirin: no Recent stressors: no Recurrent headaches: no Visual changes: no Palpitations: no Dyspnea: no Chest pain: no Lower extremity edema: no Dizzy/lightheaded: no  HYPOTHYROIDISM Thyroid control status:controlled Satisfied with current treatment? yes Medication side effects: no Medication compliance: excellent compliance Recent dose adjustment:no Fatigue: no Cold intolerance: no Heat intolerance: no Weight gain: no Weight loss: no Constipation: no Diarrhea/loose stools: no Palpitations: no Lower extremity edema: no Anxiety/depressed mood: no  Active Ambulatory Problems    Diagnosis Date Noted   Hypercholesterolemia with hypertriglyceridemia 09/05/2014   Hypothyroidism 09/05/2014   Class 1 obesity due to excess calories without serious comorbidity with body mass index (BMI)  of 33.0 to 33.9 in adult 06/07/2022   Primary hypertension 12/07/2022   Resolved Ambulatory Problems    Diagnosis Date Noted   PCOS (polycystic ovarian syndrome) 09/05/2014   Hirsutism 09/05/2014   Allergic rhinitis 09/05/2014   Low HDL (under 40) 09/05/2014   Annual physical exam 11/27/2020   Morbid obesity (HCC) 11/27/2020   Screening for diabetes mellitus (DM) 11/27/2020   Encounter for screening for human immunodeficiency virus (HIV) 11/27/2020   Encounter for hepatitis C screening test for low risk patient 11/27/2020   Elevated TSH 11/27/2020   Elevated serum creatinine 11/27/2020   Breast cancer screening by mammogram 11/27/2020   Enlarged lymph node in neck 11/27/2020   Need for shingles vaccine 11/27/2020   Elevated blood pressure reading in office without diagnosis of hypertension 12/03/2021   Elevated triglycerides with high cholesterol 12/03/2021   B12 deficiency 12/03/2021   Avitaminosis D 12/03/2021   Screening for cervical cancer 12/07/2022   Screening mammogram for breast cancer 12/07/2022   Past Medical History:  Diagnosis Date   Allergy    Anemia    History of chicken pox    History of measles    Hyperlipidemia    Thyroid disease    Past Surgical History:  Procedure Laterality Date   CESAREAN SECTION     CHOLECYSTECTOMY  02/2002   LAPAROSCOPIC ABDOMINAL EXPLORATION     Outpatient Encounter Medications as of 07/19/2023  Medication Sig   ADK 5000-5000-500 UNIT-MCG CAPS Take 1 tablet by mouth daily.   Barberry-Oreg Grape-Goldenseal (BERBERINE COMPLEX PO) Take 2 tablets by mouth daily.   co-enzyme Q-10 30 MG capsule Take 200 mg by mouth daily.   fluticasone (FLONASE) 50 MCG/ACT nasal spray Place into both nostrils daily.   levothyroxine  (SYNTHROID ) 137 MCG tablet  Take 1 tablet (137 mcg total) by mouth daily before breakfast.   magnesium oxide (MAG-OX) 400 (240 Mg) MG tablet Take 400 mg by mouth daily.   Omega-3 Fatty Acids (FISH OIL) 300 MG CAPS Take 330 mg  by mouth daily.   Semaglutide -Weight Management (WEGOVY ) 0.25 MG/0.5ML SOAJ Inject 0.25 mg into the skin once a week.   [START ON 08/16/2023] Semaglutide -Weight Management (WEGOVY ) 0.5 MG/0.5ML SOAJ Inject 0.5 mg into the skin once a week.   spironolactone  (ALDACTONE ) 50 MG tablet Take 1 tablet (50 mg total) by mouth daily.   [DISCONTINUED] Cholecalciferol (VITAMIN D3) 5000 units TABS Take by mouth.   [DISCONTINUED] gemfibrozil  (LOPID ) 600 MG tablet Take 1 tablet (600 mg total) by mouth 2 (two) times daily before a meal.   [DISCONTINUED] levothyroxine  (SYNTHROID ) 112 MCG tablet Take 1 tablet (112 mcg total) by mouth daily along with 25 mcg tablet to total 137mcg daily.   [DISCONTINUED] levothyroxine  (SYNTHROID ) 25 MCG tablet Take 1 tablet (25 mcg total) by mouth daily along with 1 tablet (112 mcg) daily to total 137 mcg daily   [DISCONTINUED] lisinopril  (ZESTRIL ) 10 MG tablet Take 1 tablet (10 mg total) by mouth daily.   [DISCONTINUED] rosuvastatin  (CRESTOR ) 40 MG tablet Take 1 tablet (40 mg total) by mouth daily.   gemfibrozil  (LOPID ) 600 MG tablet Take 1 tablet (600 mg total) by mouth 2 (two) times daily before a meal.   rosuvastatin  (CRESTOR ) 40 MG tablet Take 1 tablet (40 mg total) by mouth daily.   [DISCONTINUED] metFORMIN  (GLUCOPHAGE -XR) 500 MG 24 hr tablet Take 2 tablets (1,000 mg total) by mouth daily with breakfast. (Patient not taking: Reported on 07/19/2023)   [DISCONTINUED] Semaglutide -Weight Management 1.7 MG/0.75ML SOAJ Inject 1.7 mg into the skin once a week. (Patient not taking: Reported on 07/19/2023)   No facility-administered encounter medications on file as of 07/19/2023.   Allergies  Allergen Reactions   Lisinopril  Cough    Social History   Socioeconomic History   Marital status: Married    Spouse name: Not on file   Number of children: 1   Years of education: Not on file   Highest education level: Not on file  Occupational History   Not on file  Tobacco Use   Smoking  status: Former    Current packs/day: 0.00    Average packs/day: 0.3 packs/day for 2.0 years (0.5 ttl pk-yrs)    Types: Cigarettes    Start date: 01/16/1998    Quit date: 01/17/2000    Years since quitting: 23.5   Smokeless tobacco: Never  Substance and Sexual Activity   Alcohol use: No   Drug use: No   Sexual activity: Not on file  Other Topics Concern   Not on file  Social History Narrative   Not on file   Social Drivers of Health   Financial Resource Strain: Not on file  Food Insecurity: Not on file  Transportation Needs: Not on file  Physical Activity: Not on file  Stress: Not on file  Social Connections: Not on file   Family History  Problem Relation Age of Onset   Alzheimer's disease Mother    Thyroid disease Mother    Alzheimer's disease Father    Diabetes Sister    Hyperlipidemia Brother    Breast cancer Neg Hx      Review of Systems  Constitutional: Negative.   Respiratory: Negative.    Cardiovascular: Negative.   Musculoskeletal: Negative.   Neurological: Negative.   Psychiatric/Behavioral: Negative.  Per HPI unless specifically indicated above     Objective:    BP (!) 156/72   Pulse 79   Temp 98 F (36.7 C) (Oral)   Ht 5' (1.524 m)   Wt 183 lb (83 kg)   LMP 03/17/2016   SpO2 96%   BMI 35.74 kg/m   Wt Readings from Last 3 Encounters:  07/19/23 183 lb (83 kg)  12/07/22 173 lb (78.5 kg)  06/07/22 172 lb 6.4 oz (78.2 kg)    Physical Exam Vitals and nursing note reviewed.  Constitutional:      General: She is not in acute distress.    Appearance: Normal appearance. She is obese. She is not ill-appearing, toxic-appearing or diaphoretic.  HENT:     Head: Normocephalic and atraumatic.     Right Ear: External ear normal.     Left Ear: External ear normal.     Nose: Nose normal.     Mouth/Throat:     Mouth: Mucous membranes are moist.     Pharynx: Oropharynx is clear.  Eyes:     General: No scleral icterus.       Right eye: No  discharge.        Left eye: No discharge.     Extraocular Movements: Extraocular movements intact.     Conjunctiva/sclera: Conjunctivae normal.     Pupils: Pupils are equal, round, and reactive to light.  Cardiovascular:     Rate and Rhythm: Normal rate and regular rhythm.     Pulses: Normal pulses.     Heart sounds: Normal heart sounds. No murmur heard.    No friction rub. No gallop.  Pulmonary:     Effort: Pulmonary effort is normal. No respiratory distress.     Breath sounds: Normal breath sounds. No stridor. No wheezing, rhonchi or rales.  Chest:     Chest wall: No tenderness.  Musculoskeletal:        General: Normal range of motion.     Cervical back: Normal range of motion and neck supple.  Skin:    General: Skin is warm and dry.     Capillary Refill: Capillary refill takes less than 2 seconds.     Coloration: Skin is not jaundiced or pale.     Findings: No bruising, erythema, lesion or rash.  Neurological:     General: No focal deficit present.     Mental Status: She is alert and oriented to person, place, and time. Mental status is at baseline.  Psychiatric:        Mood and Affect: Mood normal.        Behavior: Behavior normal.        Thought Content: Thought content normal.        Judgment: Judgment normal.     Results for orders placed or performed in visit on 07/19/23  Bayer DCA Hb A1c Waived   Collection Time: 07/19/23 12:04 PM  Result Value Ref Range   HB A1C (BAYER DCA - WAIVED) 5.8 (H) 4.8 - 5.6 %      Assessment & Plan:   Problem List Items Addressed This Visit       Cardiovascular and Mediastinum   Primary hypertension   Running a little high. Will hold on increasing medicine as we're starting wegovy - follow up in about 8 weeks.       Relevant Medications   gemfibrozil  (LOPID ) 600 MG tablet   rosuvastatin  (CRESTOR ) 40 MG tablet     Endocrine   Hypothyroidism -  Primary (Chronic)   Under good control on current regimen. Continue current regimen.  Continue to monitor. Call with any concerns. Refills given. Labs drawn last week and normal.       Relevant Medications   levothyroxine  (SYNTHROID ) 137 MCG tablet     Other   Hypercholesterolemia with hypertriglyceridemia (Chronic)   Under good control on current regimen. Continue current regimen. Continue to monitor. Call with any concerns. Refills given. Labs drawn last week and normal.        Relevant Medications   gemfibrozil  (LOPID ) 600 MG tablet   rosuvastatin  (CRESTOR ) 40 MG tablet   Class 1 obesity due to excess calories without serious comorbidity with body mass index (BMI) of 33.0 to 33.9 in adult   She does qualify for wegovy  based on weight. Did well with it previously. Will restart.       Relevant Medications   Semaglutide -Weight Management (WEGOVY ) 0.25 MG/0.5ML SOAJ   Semaglutide -Weight Management (WEGOVY ) 0.5 MG/0.5ML SOAJ (Start on 08/16/2023)   Other Visit Diagnoses       Hyperglycemia       Checking A1c today. Await results.   Relevant Orders   Bayer DCA Hb A1c Waived (Completed)     Encounter for screening mammogram for malignant neoplasm of breast       Mammo ordered today.   Relevant Orders   MM 3D SCREENING MAMMOGRAM BILATERAL BREAST        Follow up plan: Return in about 2 months (around 09/19/2023).

## 2023-07-20 ENCOUNTER — Encounter: Payer: Self-pay | Admitting: Family Medicine

## 2023-07-20 NOTE — Assessment & Plan Note (Signed)
Under good control on current regimen. Continue current regimen. Continue to monitor. Call with any concerns. Refills given. Labs drawn last week and normal.   

## 2023-07-20 NOTE — Assessment & Plan Note (Signed)
 She does qualify for wegovy  based on weight. Did well with it previously. Will restart.

## 2023-07-20 NOTE — Assessment & Plan Note (Signed)
 Running a little high. Will hold on increasing medicine as we're starting wegovy - follow up in about 8 weeks.

## 2023-07-28 ENCOUNTER — Other Ambulatory Visit: Payer: Self-pay

## 2023-09-11 ENCOUNTER — Other Ambulatory Visit: Payer: Self-pay

## 2023-09-19 ENCOUNTER — Encounter: Payer: Self-pay | Admitting: Family Medicine

## 2023-09-19 ENCOUNTER — Ambulatory Visit: Admitting: Family Medicine

## 2023-09-19 ENCOUNTER — Other Ambulatory Visit: Payer: Self-pay

## 2023-09-19 VITALS — BP 139/80 | HR 75 | Ht 60.0 in | Wt 182.6 lb

## 2023-09-19 DIAGNOSIS — I10 Essential (primary) hypertension: Secondary | ICD-10-CM

## 2023-09-19 DIAGNOSIS — Z6833 Body mass index (BMI) 33.0-33.9, adult: Secondary | ICD-10-CM

## 2023-09-19 DIAGNOSIS — E6609 Other obesity due to excess calories: Secondary | ICD-10-CM

## 2023-09-19 DIAGNOSIS — E66811 Obesity, class 1: Secondary | ICD-10-CM | POA: Diagnosis not present

## 2023-09-19 MED ORDER — WEGOVY 1 MG/0.5ML ~~LOC~~ SOAJ
1.0000 mg | SUBCUTANEOUS | 1 refills | Status: DC
  Filled 2023-09-19 – 2023-10-04 (×2): qty 2, 28d supply, fill #0

## 2023-09-19 NOTE — Assessment & Plan Note (Signed)
Under good control on current regimen. Continue current regimen. Continue to monitor. Call with any concerns. Refills up to date.   

## 2023-09-19 NOTE — Assessment & Plan Note (Signed)
 Tolerating her medicine well without much weight loss right now. Will continue titration and recheck in about 6-8 weeks. Call with any concerns.

## 2023-09-19 NOTE — Progress Notes (Signed)
 BP 139/80 (BP Location: Left Arm, Patient Position: Sitting, Cuff Size: Normal)   Pulse 75   Ht 5' (1.524 m)   Wt 182 lb 9.6 oz (82.8 kg)   LMP 03/17/2016   SpO2 98%   BMI 35.66 kg/m    Subjective:    Patient ID: Gail Hunter, female    DOB: January 12, 1961, 63 y.o.   MRN: [id number]  HPI: Gail Hunter is a 63 y.o. female  Chief Complaint  Patient presents with   Hypertension   Obesity   HYPERTENSION  Hypertension status: controlled  Satisfied with current treatment? yes Duration of hypertension: chronic BP monitoring frequency:  rarely BP medication side effects:  no Medication compliance: excellent compliance Previous BP meds: spironolactone  Aspirin: no Recurrent headaches: no Visual changes: no Palpitations: no Dyspnea: no Chest pain: no Lower extremity edema: no Dizzy/lightheaded: no  OBESITY- was able to start wegovy , 5 weeks in right now. Tolerating her medicine well. No concerns. Duration: chronic Previous attempts at weight loss: yes- diet, exercise, portion control wegovy  Complications of obesity: IFG, HTN, HLD Peak weight: 184lbs Weight loss goal: to be healthy Weight loss to date: 1lb Requesting obesity pharmacotherapy: yes Current weight loss supplements/medications: no Previous weight loss supplements/meds: yes  Relevant past medical, surgical, family and social history reviewed and updated as indicated. Interim medical history since our last visit reviewed. Allergies and medications reviewed and updated.  Review of Systems  Constitutional: Negative.   Respiratory: Negative.    Cardiovascular: Negative.   Musculoskeletal: Negative.   Psychiatric/Behavioral: Negative.      Per HPI unless specifically indicated above     Objective:    BP 139/80 (BP Location: Left Arm, Patient Position: Sitting, Cuff Size: Normal)   Pulse 75   Ht 5' (1.524 m)   Wt 182 lb 9.6 oz (82.8 kg)   LMP 03/17/2016   SpO2 98%   BMI 35.66 kg/m   Wt Readings from  Last 3 Encounters:  09/19/23 182 lb 9.6 oz (82.8 kg)  07/19/23 183 lb (83 kg)  12/07/22 173 lb (78.5 kg)    Physical Exam Vitals and nursing note reviewed.  Constitutional:      General: She is not in acute distress.    Appearance: Normal appearance. She is obese. She is not ill-appearing, toxic-appearing or diaphoretic.  HENT:     Head: Normocephalic and atraumatic.     Right Ear: External ear normal.     Left Ear: External ear normal.     Nose: Nose normal.     Mouth/Throat:     Mouth: Mucous membranes are moist.     Pharynx: Oropharynx is clear.  Eyes:     General: No scleral icterus.       Right eye: No discharge.        Left eye: No discharge.     Extraocular Movements: Extraocular movements intact.     Conjunctiva/sclera: Conjunctivae normal.     Pupils: Pupils are equal, round, and reactive to light.  Cardiovascular:     Rate and Rhythm: Normal rate and regular rhythm.     Pulses: Normal pulses.     Heart sounds: Normal heart sounds. No murmur heard.    No friction rub. No gallop.  Pulmonary:     Effort: Pulmonary effort is normal. No respiratory distress.     Breath sounds: Normal breath sounds. No stridor. No wheezing, rhonchi or rales.  Chest:     Chest wall: No tenderness.  Musculoskeletal:  General: Normal range of motion.     Cervical back: Normal range of motion and neck supple.  Skin:    General: Skin is warm and dry.     Capillary Refill: Capillary refill takes less than 2 seconds.     Coloration: Skin is not jaundiced or pale.     Findings: No bruising, erythema, lesion or rash.  Neurological:     General: No focal deficit present.     Mental Status: She is alert and oriented to person, place, and time. Mental status is at baseline.  Psychiatric:        Mood and Affect: Mood normal.        Behavior: Behavior normal.        Thought Content: Thought content normal.        Judgment: Judgment normal.     Results for orders placed or performed  in visit on 07/19/23  Bayer DCA Hb A1c Waived   Collection Time: 07/19/23 12:04 PM  Result Value Ref Range   HB A1C (BAYER DCA - WAIVED) 5.8 (H) 4.8 - 5.6 %      Assessment & Plan:   Problem List Items Addressed This Visit       Cardiovascular and Mediastinum   Primary hypertension   Under good control on current regimen. Continue current regimen. Continue to monitor. Call with any concerns. Refills up to date.          Other   Class 1 obesity due to excess calories without serious comorbidity with body mass index (BMI) of 33.0 to 33.9 in adult - Primary   Tolerating her medicine well without much weight loss right now. Will continue titration and recheck in about 6-8 weeks. Call with any concerns.       Relevant Medications   semaglutide -weight management (WEGOVY ) 1 MG/0.5ML SOAJ SQ injection     Follow up plan: Return 6-8 weeks.

## 2023-09-20 ENCOUNTER — Other Ambulatory Visit: Payer: Self-pay

## 2023-09-27 ENCOUNTER — Ambulatory Visit
Admission: RE | Admit: 2023-09-27 | Discharge: 2023-09-27 | Disposition: A | Discharge status: Home / Self Care | Source: Ambulatory Visit | Attending: Family Medicine | Admitting: Family Medicine

## 2023-09-27 DIAGNOSIS — Z1231 Encounter for screening mammogram for malignant neoplasm of breast: Secondary | ICD-10-CM | POA: Insufficient documentation

## 2023-09-29 ENCOUNTER — Other Ambulatory Visit: Payer: Self-pay

## 2023-10-02 ENCOUNTER — Other Ambulatory Visit: Payer: Self-pay | Admitting: Family Medicine

## 2023-10-02 DIAGNOSIS — R928 Other abnormal and inconclusive findings on diagnostic imaging of breast: Secondary | ICD-10-CM

## 2023-10-04 ENCOUNTER — Other Ambulatory Visit: Payer: Self-pay

## 2023-10-04 ENCOUNTER — Ambulatory Visit
Admission: RE | Admit: 2023-10-04 | Discharge: 2023-10-04 | Disposition: A | Discharge status: Home / Self Care | Source: Ambulatory Visit | Attending: Family Medicine | Admitting: Family Medicine

## 2023-10-04 DIAGNOSIS — R928 Other abnormal and inconclusive findings on diagnostic imaging of breast: Secondary | ICD-10-CM

## 2023-10-04 DIAGNOSIS — R92333 Mammographic heterogeneous density, bilateral breasts: Secondary | ICD-10-CM | POA: Diagnosis not present

## 2023-10-04 DIAGNOSIS — N6321 Unspecified lump in the left breast, upper outer quadrant: Secondary | ICD-10-CM | POA: Diagnosis not present

## 2023-10-09 ENCOUNTER — Encounter: Payer: Self-pay | Admitting: Family Medicine

## 2023-10-09 ENCOUNTER — Ambulatory Visit
Admission: RE | Admit: 2023-10-09 | Discharge: 2023-10-09 | Disposition: A | Discharge status: Home / Self Care | Source: Ambulatory Visit | Attending: Family Medicine | Admitting: Family Medicine

## 2023-10-09 DIAGNOSIS — R928 Other abnormal and inconclusive findings on diagnostic imaging of breast: Secondary | ICD-10-CM | POA: Diagnosis present

## 2023-10-09 DIAGNOSIS — C50412 Malignant neoplasm of upper-outer quadrant of left female breast: Secondary | ICD-10-CM | POA: Insufficient documentation

## 2023-10-09 MED ORDER — LIDOCAINE-EPINEPHRINE 1 %-1:100000 IJ SOLN
5.0000 mL | Freq: Once | INTRAMUSCULAR | Status: AC
  Administered 2023-10-09: 5 mL
  Filled 2023-10-09: qty 5

## 2023-10-09 MED ORDER — LIDOCAINE 1 % OPTIME INJ - NO CHARGE
2.0000 mL | Freq: Once | INTRAMUSCULAR | Status: AC
  Administered 2023-10-09: 2 mL
  Filled 2023-10-09: qty 2

## 2023-10-10 ENCOUNTER — Encounter: Payer: Self-pay | Admitting: *Deleted

## 2023-10-10 DIAGNOSIS — C50912 Malignant neoplasm of unspecified site of left female breast: Secondary | ICD-10-CM

## 2023-10-10 LAB — SURGICAL PATHOLOGY

## 2023-10-10 NOTE — Progress Notes (Signed)
 Received referral for newly diagnosed breast cancer from [facility].  Navigation initiated.  She will see Dr. Melanee and Dr. Cesar on Tuesday 9/30 (patient requested date)

## 2023-10-17 ENCOUNTER — Inpatient Hospital Stay

## 2023-10-17 ENCOUNTER — Ambulatory Visit: Payer: Self-pay | Admitting: General Surgery

## 2023-10-17 ENCOUNTER — Inpatient Hospital Stay: Attending: Oncology | Admitting: Oncology

## 2023-10-17 ENCOUNTER — Other Ambulatory Visit: Payer: Self-pay | Admitting: Licensed Clinical Social Worker

## 2023-10-17 ENCOUNTER — Encounter: Payer: Self-pay | Admitting: Oncology

## 2023-10-17 ENCOUNTER — Encounter: Payer: Self-pay | Admitting: *Deleted

## 2023-10-17 ENCOUNTER — Other Ambulatory Visit: Payer: Self-pay | Admitting: General Surgery

## 2023-10-17 VITALS — BP 134/77 | HR 83 | Temp 98.1°F | Resp 18 | Ht 60.0 in | Wt 181.5 lb

## 2023-10-17 DIAGNOSIS — Z9189 Other specified personal risk factors, not elsewhere classified: Secondary | ICD-10-CM

## 2023-10-17 DIAGNOSIS — C50912 Malignant neoplasm of unspecified site of left female breast: Secondary | ICD-10-CM

## 2023-10-17 DIAGNOSIS — C50412 Malignant neoplasm of upper-outer quadrant of left female breast: Secondary | ICD-10-CM

## 2023-10-17 DIAGNOSIS — Z7189 Other specified counseling: Secondary | ICD-10-CM | POA: Insufficient documentation

## 2023-10-17 DIAGNOSIS — Z1379 Encounter for other screening for genetic and chromosomal anomalies: Secondary | ICD-10-CM

## 2023-10-17 DIAGNOSIS — Z87891 Personal history of nicotine dependence: Secondary | ICD-10-CM | POA: Insufficient documentation

## 2023-10-17 DIAGNOSIS — Z170 Estrogen receptor positive status [ER+]: Secondary | ICD-10-CM | POA: Diagnosis not present

## 2023-10-17 LAB — GENETIC SCREENING ORDER

## 2023-10-17 NOTE — Progress Notes (Signed)
 Accompanied patient and family to initial medical oncology appointment.   Reviewed Breast Cancer treatment handbook.   Care plan summary given to patient.   Reviewed outreach programs and cancer center services. She is scheduled for lumpectomy on 10/8.  She will see Dr. Melanee and Dr. Lenn on 10/21.

## 2023-10-17 NOTE — H&P (View-Only) (Signed)
 History of Present Illness Gail Hunter is a 63 year old female with left breast cancer who presents for evaluation and discussion of surgical options.  She initially presented with a bilateral screening mammogram that revealed a distortion in the left breast, prompting further diagnostic imaging. A diagnostic mammogram and breast ultrasound identified a one centimeter suspicious mass at the two o'clock position, five centimeters from the nipple in the left breast.  A core biopsy of the mass confirmed invasive ductal carcinoma, grade two, which is estrogen receptor (ER) positive, progesterone receptor (PR) positive, and HER2 negative. She has no prior history of breast biopsies. Following the biopsy, she experienced no significant issues, requiring no pain medication such as Tylenol or ibuprofen, and had no bruising. Prior to the mammogram findings, there were no masses or skin changes in the breast.  There is no significant family history of breast cancer, although she mentions a great uncle with cancer. She has one sister, and her mother passed away in 11-16-2018. She had her first menstrual cycle at age 64 and her last at age 21. She had one pregnancy at age 10 and has not used hormone replacement therapy post-menopause. She used birth control in the past due to taking Aldactone .      PAST MEDICAL HISTORY:  Past Medical History:  Diagnosis Date  . HLD (hyperlipidemia)   . PCOS (polycystic ovarian syndrome)   . Thyroid disease         PAST SURGICAL HISTORY:   Past Surgical History:  Procedure Laterality Date  . CESAREAN SECTION  11-15-00  . LAPAROSCOPIC CHOLECYSTECTOMY  2002/11/16  . COLONOSCOPY  03/31/2016   Int Hemorrhoids: CBF 03/2026  . SIGMOIDOSCOPY  ~1991   Int Hemorrhoids per pt (Dr. Bertrum)         MEDICATIONS:  Outpatient Encounter Medications as of 10/17/2023  Medication Sig Dispense Refill  . cetirizine (ZYRTEC) 10 MG tablet Take 10 mg by mouth once daily.    SABRA co-enzyme  Q-10, ubiquinone, 30 mg capsule Take 200 mg by mouth once daily    . ferrous sulfate 325 (65 FE) MG tablet Take 325 mg by mouth once daily    . fluticasone propionate (FLONASE) 50 mcg/actuation nasal spray Place into one nostril once daily    . gemfibrozil  (LOPID ) 600 mg tablet 1 tab by mouth 2 times a day    . levothyroxine  137 mcg Cap Take 1 tablet by mouth once daily    . NON FORMULARY Vitamin A 5,000 IU- D3 5,000 IU  - K2 (AS MK-7) 500 mg ----- take 1 daily    . rosuvastatin  (CRESTOR ) 40 MG tablet Take 40 mg by mouth once daily    . semaglutide  (WEGOVY ) 1 mg/0.5 mL pen injector Inject 1 mg subcutaneously every 7 (seven) days    . spironolactone  (ALDACTONE ) 50 MG tablet Take 1 tablet by mouth once daily  6  . cholecalciferol (VITAMIN D ) 2,000 unit capsule 2 tab by mouth daily (Patient not taking: Reported on 10/17/2023)    . metFORMIN  (GLUMETZA ) 1000 MG (MOD) ER tablet Take 1,000 mg by mouth daily with dinner. (Patient not taking: Reported on 10/17/2023)    . peg-electrolyte (NULYTELY) solution Take 4,000 mLs by mouth as directed. As directed colon prep. 4000 mL 0   No facility-administered encounter medications on file as of 10/17/2023.     ALLERGIES:   Lisinopril    SOCIAL HISTORY:  Social History   Socioeconomic History  . Marital status: Married  Tobacco Use  .  Smoking status: Never    Passive exposure: Never  . Smokeless tobacco: Never  Vaping Use  . Vaping status: Never Used  Substance and Sexual Activity  . Drug use: Never   Social Drivers of Corporate investment banker Strain: Low Risk  (10/16/2023)   Overall Financial Resource Strain (CARDIA)   . Difficulty of Paying Living Expenses: Not hard at all  Food Insecurity: No Food Insecurity (10/16/2023)   Hunger Vital Sign   . Worried About Programme researcher, broadcasting/film/video in the Last Year: Never true   . Ran Out of Food in the Last Year: Never true  Transportation Needs: No Transportation Needs (10/16/2023)   PRAPARE - Transportation    . Lack of Transportation (Medical): No   . Lack of Transportation (Non-Medical): No    FAMILY HISTORY:  Family History  Problem Relation Name Age of Onset  . Hyperlipidemia (Elevated cholesterol) Mother    . Dementia Mother    . Dementia Father    . Diabetes Sister    . Stroke Brother       GENERAL REVIEW OF SYSTEMS:   General ROS: negative for - chills, fatigue, fever, weight gain or weight loss Allergy and Immunology ROS: negative for - hives  Hematological and Lymphatic ROS: negative for - bleeding problems or bruising, negative for palpable nodes Endocrine ROS: negative for - heat or cold intolerance, hair changes Respiratory ROS: negative for - cough, shortness of breath or wheezing Cardiovascular ROS: no chest pain or palpitations GI ROS: negative for nausea, vomiting, abdominal pain, diarrhea, constipation Musculoskeletal ROS: negative for - joint swelling or muscle pain Neurological ROS: negative for - confusion, syncope Dermatological ROS: negative for pruritus and rash  PHYSICAL EXAM:  Vitals:   10/17/23 1112  BP: (!) 142/77  Pulse: 83  .  Ht:152.4 cm (5') Wt:79.8 kg (176 lb) ADJ:Anib surface area is 1.84 meters squared. Body mass index is 34.37 kg/m.SABRA   GENERAL: Alert, active, oriented x3  HEENT: Pupils equal reactive to light. Extraocular movements are intact. Sclera clear. Palpebral conjunctiva normal red color.Pharynx clear.  NECK: Supple with no palpable mass and no adenopathy.  LUNGS: Sound clear with no rales rhonchi or wheezes.  HEART: Regular rhythm S1 and S2 without murmur.  BREAST: Breast examined in the sitting and supine position.  No palpable masses, skin changes, no nipple retraction or nipple discharge.  EXTREMITIES: Well-developed well-nourished symmetrical with no dependent edema.  NEUROLOGICAL: Awake alert oriented, facial expression symmetrical, moving all extremities.   Results RADIOLOGY Bilateral screening mammogram: Distortion  of the left breast Diagnostic mammogram: One centimeter suspicious mass at the two o'clock position of the left breast, five centimeters from the nipple Breast ultrasound: Abnormal lymph nodes  PATHOLOGY Core biopsy: Invasive ductal carcinoma, Grade 2, ER positive, PR positive, HER2 negative    Assessment & Plan Invasive ductal carcinoma of left breast, ER/PR positive, HER2 negative   The 1.1 cm tumor is located at the two o'clock position, 5 cm from the nipple. Lymph nodes appear normal on ultrasound, and there is no significant family history of breast cancer. Schedule a partial mastectomy with left  axillary sentinel lymph node biopsy for next week. Coordinate with the oncologist to finalize the treatment plan, including potential radiation and anti-estrogen therapy. Provide a prescription for pain medication post-surgery. Advise wearing a sports bra or tight bra for support and using ice packs for swelling and soreness. Recommend activity limitations based on pain levels.   Malignant neoplasm of upper-outer  quadrant of left breast in female, estrogen receptor positive (CMS/HHS-HCC) [C50.412, Z17.0]          Patient verbalized understanding, all questions were answered, and were agreeable with the plan outlined above.   Lucas Sjogren, MD  Electronically signed by Lucas Sjogren, MD

## 2023-10-18 ENCOUNTER — Ambulatory Visit: Attending: Oncology | Admitting: Occupational Therapy

## 2023-10-18 ENCOUNTER — Encounter: Payer: Self-pay | Admitting: Occupational Therapy

## 2023-10-18 DIAGNOSIS — C50912 Malignant neoplasm of unspecified site of left female breast: Secondary | ICD-10-CM | POA: Insufficient documentation

## 2023-10-18 NOTE — Therapy (Signed)
 OUTPATIENT OCCUPATIONAL THERAPY BREAST CANCER BASELINE EVALUATION   Patient Name: Gail Hunter MRN: [id number] DOB:1960/07/07, 63 y.o., female Today's Date: 10/18/2023  END OF SESSION:  OT End of Session - 10/18/23 1238     Visit Number 1    Number of Visits 3    Date for Recertification  12/13/23    OT Start Time 1033    OT Stop Time 1103    OT Time Calculation (min) 30 min    Activity Tolerance Patient tolerated treatment well    Behavior During Therapy [facility] for tasks assessed/performed          Past Medical History:  Diagnosis Date   Allergy    Anemia    History of chicken pox    History of measles    Hyperlipidemia    Thyroid disease    Past Surgical History:  Procedure Laterality Date   BREAST BIOPSY Left 10/09/2023   US  LT BREAST BX W LOC DEV 1ST LESION IMG BX SPEC US  GUIDE 10/09/2023 [facility]   CESAREAN SECTION     CHOLECYSTECTOMY  02/2002   LAPAROSCOPIC ABDOMINAL EXPLORATION     Patient Active Problem List   Diagnosis Date Noted   Primary hypertension 12/07/2022   Class 1 obesity due to excess calories without serious comorbidity with body mass index (BMI) of 33.0 to 33.9 in adult 06/07/2022   Hypercholesterolemia with hypertriglyceridemia 09/05/2014   Hypothyroidism 09/05/2014    PCP: Dr Vicci  REFERRING PROVIDER: Dr Melanee MART DIAG:  L breast cancer  THERAPY DIAG:  Primary cancer of left breast [facility])  Rationale for Evaluation and Treatment: Rehabilitation  ONSET DATE: 10/04/23  SUBJECTIVE:                                                                                                                                                                                           SUBJECTIVE STATEMENT: Patient reports she is here today after being refer by one of her medical team for her newly diagnosed left breast cancer.   PERTINENT HISTORY:  Patient was diagnosed with left  breast cancer - plan is to have  L lumpectomy by Dr  Rodolph on 10/25/2023  PATIENT GOALS:   reduce lymphedema risk and learn post op HEP.   PAIN:  Are you having pain? No pain  PRECAUTIONS: Active CA , L lumpectomy    HAND DOMINANCE: right  WEIGHT BEARING RESTRICTIONS: No  FALLS:  Has patient fallen in last 6 months? 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 [place]  LEISURE: Likes to  do crafts, scrap booking and reading   OBJECTIVE:  COGNITION: Overall cognitive status: Within functional limits for tasks assessed    POSTURE:  Forward head and rounded shoulders posture  UPPER EXTREMITY AROM/PROM: Bilateral shoulder active range of motion within normal limits in all planes  CERVICAL AROM: All within normal limits:    UPPER EXTREMITY STRENGTH: 5/5 strength in bilateral shoulder flexion, abduction external and internal rotation  LYMPHEDEMA ASSESSMENTS:   LANDMARK RIGHT   eval  10 cm proximal to olecranon process   Olecranon process   10 cm proximal to ulnar styloid process   Just proximal to ulnar styloid process   Across hand at thumb web space   At base of 2nd digit   (Blank rows = not tested)  LANDMARK LEFT   eval  10 cm proximal to olecranon process   Olecranon process   10 cm proximal to ulnar styloid process   Just proximal to ulnar styloid process   Across hand at thumb web space   At base of 2nd digit   (Blank rows = not tested)  L-DEX LYMPHEDEMA SCREENING:  The patient was assessed using the L-Dex machine today to produce a lymphedema index baseline score. The patient will be reassessed on a regular basis (typically every 3 months) to obtain new L-Dex scores. If the score is > 6.5 points away from his/her baseline score indicating onset of subclinical lymphedema, it will be recommended to wear a compression garment for 4 weeks, 12 hours per day and then be reassessed. If the score continues to be > 6.5  points from baseline at reassessment, we will initiate lymphedema treatment. Assessing in this manner has a 95% rate of preventing clinically significant lymphedema.   L-DEX FLOWSHEETS - 10/18/23 1200       L-DEX LYMPHEDEMA SCREENING   L-DEX MEASUREMENT EXTREMITY Upper Extremity    POSITION  Standing    DOMINANT SIDE Right    At Risk Side Left    BASELINE SCORE (UNILATERAL) 0.9           PATIENT EDUCATION:  Education details: Lymphedema risk reduction and post op shoulder/posture HEP Person educated: Patient Education method: Explanation, Demonstration, Handout Education comprehension: Patient verbalized understanding and returned demonstration  HOME EXERCISE PROGRAM: Patient was instructed today in home exercise program to start after surgery.  Bilateral shoulder active assisted range of motion for shoulder flexion and abduction in supine using a wand.  Pain-free.  Slight pull keeping it under 2/10 pain.  Starting with 90 degrees and then gradually can overhead as she is tolerating and okayed by Careers adviser.  Also external rotation in supine using gravity to assist with pain less than 2/10 slight pull.  Doing left separate prior to adding bilateral. Did initiate some education on lymphedema signs and symptoms and precautions.  But will further educate and review at postop visit. ASSESSMENT:  CLINICAL IMPRESSION: Her multidisciplinary medical team has met to assess and determine a recommended treatment plan. She is planning to have left lumpectomy on 10/25/2023 by Dr. Hillery was educated in a home program to start postop for bilateral shoulder range of motion.  Initiated some lymphedema education but will review further on after surgery.SABRA She will benefit from a post op OT reassessment to determine needs and from L-Dex screens every 3 months for 2 years to detect subclinical lymphedema.  Pt will benefit from skilled therapeutic intervention to improve on the following  deficits: Decreased knowledge of precautions and lymphedema education, impaired UE functional use,  pain, decreased ROM, postural dysfunction.   OT treatment/interventions: ADL/self-care home management, pt/family education, therapeutic exercise,manual therapy  REHAB POTENTIAL: Good  CLINICAL DECISION MAKING: Stable/uncomplicated  EVALUATION COMPLEXITY: Low   GOALS: Goals reviewed with patient? YES  LONG TERM GOALS: (STG=LTG)    Name Target Date Goal status  1 Pt will be able to verbalize understanding of pertinent lymphedema risk reduction practices relevant to her dx specifically related to skin care.  Baseline:  No knowledge 8 weeks Initial  2 Pt will be able to return demo and/or verbalize understanding of the post op HEP related to regaining shoulder ROM. Baseline:  No knowledge Today Achieved at eval       4 Pt will demo she has regained full shoulder ROM and function post operatively compared to baselines.  Baseline: See objective measurements taken today. 8 weeks Initial    PLAN:  OT FREQUENCY/DURATION: EVAL and 1-2 follow up appointment as needed  PLAN FOR NEXT SESSION: will reassess 2-3 weeks post op to determine needs.   Patient will follow up at outpatient cancer rehab 2-3 weeks following surgery.  If the patient requires occupational therapy at that time, a specific plan will be dictated and sent to the referring physician for approval. Occupational Therapy Information for After Breast Cancer Surgery/Treatment:  Lymphedema is a swelling condition that you may be at risk for in your arm if you have lymph nodes removed from the armpit area.  After a sentinel node biopsy, the risk is approximately 5-9% and is higher after an axillary node dissection.  There is treatment available for this condition and it is not life-threatening.  Contact your physician or occupational therapist with concerns. You may begin the 4 shoulder/posture exercises (see additional sheet) when  permitted by your physician (typically a week after surgery).  If you have drains, you may need to wait until those are removed before beginning range of motion exercises.  A general recommendation is to not lift your arms above shoulder height until drains are removed.  These exercises should be done to your tolerance and gently.  This is not a no pain/no gain type of recovery so listen to your body and stretch into the range of motion that you can tolerate, stopping if you have pain.  If you are having immediate reconstruction, ask your plastic surgeon about doing exercises as he or she may want you to wait. .  While undergoing any medical procedure or treatment, try to avoid blood pressure being taken or needle sticks from occurring on the arm on the side of cancer.   This recommendation begins after surgery and continues for the rest of your life.  This may help reduce your risk of getting lymphedema (swelling in your arm). An excellent resource for those seeking information on lymphedema is the National Lymphedema Network's web site. It can be accessed at www.lymphnet.org If you notice swelling in your hand, arm or breast at any time following surgery (even if it is many years from now), please contact your doctor or occupational therapist to discuss this.  Lymphedema can be treated at any time but it is easier for you if it is treated early on.  If you feel like your shoulder motion is not returning to normal in a reasonable amount of time, please contact your surgeon or occupational therapist.  [facility] [phone]. [address], [place], [place] [address]       Ancel Peters, OTR/L,CLT 10/18/2023, 12:41 PM

## 2023-10-20 ENCOUNTER — Other Ambulatory Visit: Payer: Self-pay

## 2023-10-20 ENCOUNTER — Encounter
Admission: RE | Admit: 2023-10-20 | Discharge: 2023-10-20 | Disposition: A | Discharge status: Home / Self Care | Source: Ambulatory Visit | Attending: General Surgery | Admitting: General Surgery

## 2023-10-20 VITALS — Ht 60.0 in | Wt 176.0 lb

## 2023-10-20 DIAGNOSIS — Z01812 Encounter for preprocedural laboratory examination: Secondary | ICD-10-CM

## 2023-10-20 DIAGNOSIS — E039 Hypothyroidism, unspecified: Secondary | ICD-10-CM

## 2023-10-20 DIAGNOSIS — I10 Essential (primary) hypertension: Secondary | ICD-10-CM

## 2023-10-20 DIAGNOSIS — Z01810 Encounter for preprocedural cardiovascular examination: Secondary | ICD-10-CM

## 2023-10-20 HISTORY — DX: Essential (primary) hypertension: I10

## 2023-10-20 NOTE — Patient Instructions (Addendum)
 Your procedure is scheduled on:  [facility] OCTOBER 8  Report to the Registration Desk on the 1st floor of the [facility] To find out your arrival time, please call [phone] between 1PM - 3PM on:  TUESDAY OCTOBER 7  If your arrival time is 6:00 am, do not arrive before that time as the Medical Mall entrance doors do not open until 6:00 am.  REMEMBER: Instructions that are not followed completely may result in serious medical risk, up to and including death; or upon the discretion of your surgeon and anesthesiologist your surgery may need to be rescheduled.  Do not eat food after midnight the night before surgery.  No gum chewing or hard candies.   One week prior to surgery: Stop Anti-inflammatories (NSAIDS) such as Advil, Aleve, Ibuprofen, Motrin, Naproxen, Naprosyn and Aspirin based products such as Excedrin, Goody's Powder, BC Powder. Stop ANY OVER THE COUNTER supplements until after surgery. ADK 5000-5000-500  Barberry-Oreg Grape-Goldenseal (BERBERINE COMPLEX PO)  Coenzyme Q10  ferrous sulfate 325  magnesium oxide (MAG-OX)  Omega-3 Fatty Acids (FISH OIL)   You may however, continue to take Tylenol if needed for pain up until the day of surgery.  **Follow guidelines for insulin and diabetes medications.** semaglutide -weight management (WEGOVY ) patient has not taken dose since Tuesday September 23    Continue taking all of your other prescription medications up until the day of surgery.  ON THE DAY OF SURGERY ONLY TAKE THESE MEDICATIONS WITH SIPS OF WATER:  levothyroxine  (SYNTHROID )   No Alcohol for 24 hours before or after surgery.  Do not use any recreational drugs for at least a week (preferably 2 weeks) before your surgery.  Please be advised that the combination of cocaine and anesthesia may have negative outcomes, up to and including death. If you test positive for cocaine, your surgery will be cancelled.  On the morning of surgery brush your teeth with  toothpaste and water, you may rinse your mouth with mouthwash if you wish. Do not swallow any toothpaste or mouthwash.  Use CHG Soap as directed on instruction sheet.  Do not wear jewelry, make-up, hairpins, clips or nail polish.  For welded (permanent) jewelry: bracelets, anklets, waist bands, etc.  Please have this removed prior to surgery.  If it is not removed, there is a chance that hospital personnel will need to cut it off on the day of surgery.  Do not wear lotions, powders, or perfumes.   Do not shave body hair from the neck down 48 hours before surgery.  Do not bring valuables to the hospital. [facility] is not responsible for any missing/lost belongings or valuables.   Notify your doctor if there is any change in your medical condition (cold, fever, infection).  Wear comfortable clothing (specific to your surgery type) to the hospital.  After surgery, you can help prevent lung complications by doing breathing exercises.  Take deep breaths and cough every 1-2 hours.   If you are being discharged the day of surgery, you will not be allowed to drive home. You will need a responsible individual to drive you home and stay with you for 24 hours after surgery.   If you are taking public transportation, you will need to have a responsible individual with you.  Please call the Pre-admissions Testing Dept. at [phone] if you have any questions about these instructions.  Surgery Visitation Policy:  Patients having surgery or a procedure may have two visitors.  Children under the age of 66 must  have an adult with them who is not the patient.  Merchandiser, retail to address health-related social needs:  [email or web address]                                                                                                              Preparing for Surgery with CHLORHEXIDINE GLUCONATE (CHG) Soap  Chlorhexidine Gluconate (CHG) Soap  o An antiseptic  cleaner that kills germs and bonds with the skin to continue killing germs even after washing  o Used for showering the night before surgery and morning of surgery  Before surgery, you can play an important role by reducing the number of germs on your skin.  CHG (Chlorhexidine gluconate) soap is an antiseptic cleanser which kills germs and bonds with the skin to continue killing germs even after washing.  Please do not use if you have an allergy to CHG or antibacterial soaps. If your skin becomes reddened/irritated stop using the CHG.  1. Shower the NIGHT BEFORE SURGERY with CHG soap.  2. If you choose to wash your hair, wash your hair first as usual with your normal shampoo.  3. After shampooing, rinse your hair and body thoroughly to remove the shampoo.  4. Use CHG as you would any other liquid soap. You can apply CHG directly to the skin and wash gently with a clean washcloth.  5. Apply the CHG soap to your body only from the neck down. Do not use on open wounds or open sores. Avoid contact with your eyes, ears, mouth, and genitals (private parts). Wash face and genitals (private parts) with your normal soap.  6. Wash thoroughly, paying special attention to the area where your surgery will be performed.  7. Thoroughly rinse your body with warm water.  8. Do not shower/wash with your normal soap after using and rinsing off the CHG soap.  9. Do not use lotions, oils, etc., after showering with CHG.  10. Pat yourself dry with a clean towel.  11. Wear clean pajamas to bed the night before surgery.  12. Place clean sheets on your bed the night of your shower and do not sleep with pets.  13. Do not apply any deodorants/lotions/powders.  14. Please wear clean clothes to the hospital.  15. Remember to brush your teeth with your regular toothpaste.

## 2023-10-22 DIAGNOSIS — C50412 Malignant neoplasm of upper-outer quadrant of left female breast: Secondary | ICD-10-CM | POA: Insufficient documentation

## 2023-10-22 NOTE — Progress Notes (Signed)
 Hematology/Oncology Consult note [facility] [phone] [phone]  Patient Care Team: Vicci Duwaine SQUIBB, DO as PCP - General (Family Medicine) Georgina Shasta POUR, RN as Oncology Nurse Navigator   Name of the patient: Gail Hunter  [id number]  1960-11-27    Reason for referral- new diagnosis of breast cancer   Referring physician-Dr. Duwaine Vicci  Date of visit: 10/22/23   History of presenting illness- Patient is a 63 year old female with a past medical history significant for hypercholesteremia hypertension and hypothyroidism who underwent a routine screening mammogram on 09/27/2023 which showed possible distortion in the left breast.  This was followed by a diagnostic mammogram and an ultrasound which showed 1 cm suspicious mass at the 2 o'clock position of the left breast 5 cm from the nipple.  Left axillary lymph nodes appeared normal.  Patient had a core biopsy of this mass which was consistent with invasive mammary carcinoma grade two 1 cm ER 99% positive, PR 100% positive Ki-67 5% and HER2 negative +1.  Patient has met with Dr. Cesar and will be undergoing lumpectomy and sentinel lymph node biopsy.  No family history of breast cancer or colon cancer.  Menarche at the age of 73.  Menopause at 56.  G1 P1.  Remote use of birth control in the past.  No use of hormone replacement therapy.  ECOG PS- 0  Pain scale- 0   Review of systems- Review of Systems  Constitutional:  Negative for chills, fever, malaise/fatigue and weight loss.  HENT:  Negative for congestion, ear discharge and nosebleeds.   Eyes:  Negative for blurred vision.  Respiratory:  Negative for cough, hemoptysis, sputum production, shortness of breath and wheezing.   Cardiovascular:  Negative for chest pain, palpitations, orthopnea and claudication.  Gastrointestinal:  Negative for abdominal pain, blood in stool, constipation, diarrhea, heartburn, melena, nausea and vomiting.   Genitourinary:  Negative for dysuria, flank pain, frequency, hematuria and urgency.  Musculoskeletal:  Negative for back pain, joint pain and myalgias.  Skin:  Negative for rash.  Neurological:  Negative for dizziness, tingling, focal weakness, seizures, weakness and headaches.  Endo/Heme/Allergies:  Does not bruise/bleed easily.  Psychiatric/Behavioral:  Negative for depression and suicidal ideas. The patient does not have insomnia.     Allergies  Allergen Reactions   Lisinopril  Cough    Patient Active Problem List   Diagnosis Date Noted   Primary hypertension 12/07/2022   Class 1 obesity due to excess calories without serious comorbidity with body mass index (BMI) of 33.0 to 33.9 in adult 06/07/2022   Hypercholesterolemia with hypertriglyceridemia 09/05/2014   Hypothyroidism 09/05/2014     Past Medical History:  Diagnosis Date   Allergy    Anemia    History of chicken pox    History of measles    Hyperlipidemia    Hypertension    Thyroid disease      Past Surgical History:  Procedure Laterality Date   BREAST BIOPSY Left 10/09/2023   US  LT BREAST BX W LOC DEV 1ST LESION IMG BX SPEC US  GUIDE 10/09/2023 [facility]   CESAREAN SECTION     CHOLECYSTECTOMY  02/2002   LAPAROSCOPIC ABDOMINAL EXPLORATION      Social History   Socioeconomic History   Marital status: Married    Spouse name: Garnette   Number of children: 1   Years of education: Not on file   Highest education level: Not on file  Occupational History   Not on file  Tobacco Use  Smoking status: Former    Current packs/day: 0.00    Types: Cigarettes    Quit date: 01/17/2000    Years since quitting: 23.7   Smokeless tobacco: Never  Vaping Use   Vaping status: Never Used  Substance and Sexual Activity   Alcohol use: No   Drug use: No   Sexual activity: Not Currently  Other Topics Concern   Not on file  Social History Narrative   Not on file   Social Drivers of Health   Financial  Resource Strain: Low Risk  (10/16/2023)   Received from [facility]   Overall Financial Resource Strain (CARDIA)    Difficulty of Paying Living Expenses: Not hard at all  Food Insecurity: No Food Insecurity (10/17/2023)   Hunger Vital Sign    Worried About Running Out of Food in the Last Year: Never true    Ran Out of Food in the Last Year: Never true  Transportation Needs: No Transportation Needs (10/17/2023)   PRAPARE - Administrator, Civil Service (Medical): No    Lack of Transportation (Non-Medical): No  Physical Activity: Not on file  Stress: Not on file  Social Connections: Not on file  Intimate Partner Violence: Not At Risk (10/17/2023)   Humiliation, Afraid, Rape, and Kick questionnaire    Fear of Current or Ex-Partner: No    Emotionally Abused: No    Physically Abused: No    Sexually Abused: No     Family History  Problem Relation Age of Onset   Alzheimer's disease Mother    Thyroid disease Mother    Alzheimer's disease Father    Diabetes Sister    Hyperlipidemia Brother    Breast cancer Neg Hx      Current Outpatient Medications:    ADK 5000-5000-500 UNIT-MCG CAPS, Take 1 tablet by mouth daily., Disp: , Rfl:    Barberry-Oreg Grape-Goldenseal (BERBERINE COMPLEX PO), Take 2 tablets by mouth daily., Disp: , Rfl:    Coenzyme Q10 200 MG capsule, Take 200 mg by mouth daily., Disp: , Rfl:    ferrous sulfate 325 (65 FE) MG tablet, Take 325 mg by mouth daily., Disp: , Rfl:    fluticasone (FLONASE) 50 MCG/ACT nasal spray, Place 1 spray into both nostrils daily., Disp: , Rfl:    gemfibrozil  (LOPID ) 600 MG tablet, Take 1 tablet (600 mg total) by mouth 2 (two) times daily before a meal., Disp: 180 tablet, Rfl: 1   levothyroxine  (SYNTHROID ) 137 MCG tablet, Take 1 tablet (137 mcg total) by mouth daily before breakfast., Disp: 90 tablet, Rfl: 3   magnesium oxide (MAG-OX) 400 (240 Mg) MG tablet, Take 400 mg by mouth daily., Disp: , Rfl:    Omega-3 Fatty  Acids (FISH OIL) 300 MG CAPS, Take 300 mg by mouth daily., Disp: , Rfl:    rosuvastatin  (CRESTOR ) 40 MG tablet, Take 1 tablet (40 mg total) by mouth daily., Disp: 90 tablet, Rfl: 1   semaglutide -weight management (WEGOVY ) 1 MG/0.5ML SOAJ SQ injection, Inject 1 mg into the skin once a week., Disp: 2 mL, Rfl: 1   spironolactone  (ALDACTONE ) 50 MG tablet, Take 1 tablet (50 mg total) by mouth daily., Disp: 90 tablet, Rfl: 4   Physical exam:  Vitals:   10/17/23 1444  BP: 134/77  Pulse: 83  Resp: 18  Temp: 98.1 F (36.7 C)  TempSrc: Tympanic  SpO2: 100%  Weight: 181 lb 8 oz (82.3 kg)  Height: 5' (1.524 m)   Physical Exam Cardiovascular:  Rate and Rhythm: Normal rate and regular rhythm.     Heart sounds: Normal heart sounds.  Pulmonary:     Effort: Pulmonary effort is normal.     Breath sounds: Normal breath sounds.  Abdominal:     General: Bowel sounds are normal.     Palpations: Abdomen is soft.  Skin:    General: Skin is warm and dry.  Neurological:     Mental Status: She is alert and oriented to person, place, and time.     Breast exam: Induration at the site of recent breast biopsy.  No palpable masses in either breast.  No palpable bilateral axillary adenopathy.      Latest Ref Rng & Units 07/14/2023    8:16 AM  CMP  Glucose 70 - 99 mg/dL 885   BUN 8 - 27 mg/dL 15   Creatinine 9.42 - 1.00 mg/dL 8.99   Sodium 865 - 855 mmol/L 141   Potassium 3.5 - 5.2 mmol/L 4.3   Chloride 96 - 106 mmol/L 106   Calcium  8.7 - 10.3 mg/dL 9.3   Total Protein 6.0 - 8.5 g/dL 6.9   Total Bilirubin 0.0 - 1.2 mg/dL 0.2   Alkaline Phos 44 - 121 IU/L 68   AST 0 - 40 IU/L 18   ALT 0 - 32 IU/L 12       Latest Ref Rng & Units 07/14/2023    8:16 AM  CBC  WBC 3.4 - 10.8 x10E3/uL 6.4   Hemoglobin 11.1 - 15.9 g/dL 88.6   Hematocrit 65.9 - 46.6 % 35.9   Platelets 150 - 450 x10E3/uL 345     No images are attached to the encounter.  US  LT BREAST BX W LOC DEV 1ST LESION IMG BX SPEC US   GUIDE Addendum Date: 10/10/2023 ADDENDUM REPORT: 10/10/2023 14:02 ADDENDUM: PATHOLOGY revealed: 1. Breast, left, needle core biopsy, 2:00 5 cmfn - INVASIVE DUCTAL CARCINOMA - DUCTAL CARCINOMA IN SITU, SOLID TYPE, INTERMEDIATE NUCLEAR GRADE - OVERALL GRADE: 2 - LYMPHOVASCULAR INVASION: NOT IDENTIFIED - CANCER LENGTH: 1.0 CM / 10 MM - CALCIFICATIONS: NOT IDENTIFIED. Pathology results are CONCORDANT with imaging findings, per Dr. Corean Salter. Pathology results and recommendations were discussed with patient via telephone on 10/10/2023 by Jacquline Cooter RN. Patient reported biopsy site doing well with no adverse symptoms, and slight tenderness at the site. Post biopsy care instructions were reviewed, questions were answered and my direct phone number was provided. Patient was instructed to call [facility] for any additional questions or concerns related to biopsy site. RECOMMENDATIONS: 1. Surgical and oncological consultation. Request for surgical and oncological consultation relayed to Shasta Ada RN at [facility] by Jacquline Cooter RN on 10/10/2023. 2. Pretreatment breast MR would be recommended given breast density. Pathology results reported by Jacquline Cooter RN on 10/10/2023. Electronically Signed   By: Corean Salter M.D.   On: 10/10/2023 14:02   Result Date: 10/10/2023 CLINICAL DATA:  Suspicious LEFT breast mass and distortion EXAM: ULTRASOUND GUIDED LEFT BREAST CORE NEEDLE BIOPSY COMPARISON:  Previous exam(s). PROCEDURE: I met with the patient and we discussed the procedure of ultrasound-guided biopsy, including benefits and alternatives. We discussed the high likelihood of a successful procedure. We discussed the risks of the procedure, including infection, bleeding, tissue injury, clip migration, and inadequate sampling. Informed written consent was given. The usual time-out protocol was performed immediately prior to the procedure. Lesion quadrant: Upper outer  quadrant Using sterile technique and 1% lidocaine  and 1% lidocaine  with epinephrine   as local anesthetic, under direct ultrasound visualization, a 14 gauge spring-loaded device was used to perform biopsy of a mass at 2 o'clock 5 cm from the nipple using a lateral approach. At the conclusion of the procedure a SAVI scout was deployed into the biopsy cavity. Follow up 2 view mammogram was performed and dictated separately. IMPRESSION: Ultrasound guided biopsy of a LEFT breast mass. No apparent complications. Electronically Signed: By: Corean Salter M.D. On: 10/09/2023 14:14   MM CLIP PLACEMENT LEFT Result Date: 10/09/2023 CLINICAL DATA:  Status post ultrasound-guided biopsy EXAM: 3D DIAGNOSTIC LEFT MAMMOGRAM POST ULTRASOUND BIOPSY COMPARISON:  Previous exam(s). ACR Breast Density Category c: The breasts are heterogeneously dense, which may obscure small masses. FINDINGS: 3D Mammographic images were obtained following ultrasound guided biopsy of a LEFT breast mass. The SAVI scout is in expected position at the site of biopsy. IMPRESSION: Appropriate positioning of the SAVI scout at the site of biopsy in the LEFT upper outer breast at site of architectural distortion. Final Assessment: Post Procedure Mammograms for Marker Placement Electronically Signed   By: Corean Salter M.D.   On: 10/09/2023 14:14   MM 3D DIAGNOSTIC MAMMOGRAM UNILATERAL LEFT BREAST Result Date: 10/04/2023 CLINICAL DATA:  Recall from screening to evaluate possible left breast distortion. EXAM: DIGITAL DIAGNOSTIC UNILATERAL LEFT MAMMOGRAM WITH TOMOSYNTHESIS AND CAD; ULTRASOUND LEFT BREAST LIMITED TECHNIQUE: Left digital diagnostic mammography and breast tomosynthesis was performed. The images were evaluated with computer-aided detection. ; Targeted ultrasound examination of the left breast was performed. COMPARISON:  Previous exam(s). ACR Breast Density Category c: The breasts are heterogeneously dense, which may obscure small masses.  FINDINGS: Spot compression and true lateral images of the left breast were obtained. There is persistent focal distortion over the outer mid to upper left breast. Targeted ultrasound is performed, showing an irregular shaped hypoechoic mass with indistinct borders over the 2 o'clock position of the left breast 5 cm from the nipple corresponding to the mammographic distortion. This measures 0.7 x 0.8 x 1 cm. Ultrasound of the left axilla is normal. IMPRESSION: 1 cm suspicious mass over the 2 o'clock position of the left breast 5 cm from the nipple correlating to the mammographic distortion. No abnormal left axillary lymph nodes. RECOMMENDATION: Recommend ultrasound-guided core needle biopsy for further evaluation. I have discussed the findings and recommendations with the patient. If applicable, a reminder letter will be sent to the patient regarding the next appointment. BI-RADS CATEGORY  5: Highly suggestive of malignancy. Biopsy scheduling will be facilitated by the ultrasound technologist prior to patient's departure. Electronically Signed   By: Toribio Agreste M.D.   On: 10/04/2023 16:08   US  LIMITED ULTRASOUND INCLUDING AXILLA LEFT BREAST  Result Date: 10/04/2023 CLINICAL DATA:  Recall from screening to evaluate possible left breast distortion. EXAM: DIGITAL DIAGNOSTIC UNILATERAL LEFT MAMMOGRAM WITH TOMOSYNTHESIS AND CAD; ULTRASOUND LEFT BREAST LIMITED TECHNIQUE: Left digital diagnostic mammography and breast tomosynthesis was performed. The images were evaluated with computer-aided detection. ; Targeted ultrasound examination of the left breast was performed. COMPARISON:  Previous exam(s). ACR Breast Density Category c: The breasts are heterogeneously dense, which may obscure small masses. FINDINGS: Spot compression and true lateral images of the left breast were obtained. There is persistent focal distortion over the outer mid to upper left breast. Targeted ultrasound is performed, showing an irregular  shaped hypoechoic mass with indistinct borders over the 2 o'clock position of the left breast 5 cm from the nipple corresponding to the mammographic distortion. This measures 0.7 x 0.8  x 1 cm. Ultrasound of the left axilla is normal. IMPRESSION: 1 cm suspicious mass over the 2 o'clock position of the left breast 5 cm from the nipple correlating to the mammographic distortion. No abnormal left axillary lymph nodes. RECOMMENDATION: Recommend ultrasound-guided core needle biopsy for further evaluation. I have discussed the findings and recommendations with the patient. If applicable, a reminder letter will be sent to the patient regarding the next appointment. BI-RADS CATEGORY  5: Highly suggestive of malignancy. Biopsy scheduling will be facilitated by the ultrasound technologist prior to patient's departure. Electronically Signed   By: Toribio Agreste M.D.   On: 10/04/2023 16:08   MM 3D SCREENING MAMMOGRAM BILATERAL BREAST Result Date: 10/02/2023 CLINICAL DATA:  Screening. EXAM: DIGITAL SCREENING BILATERAL MAMMOGRAM WITH TOMOSYNTHESIS AND CAD TECHNIQUE: Bilateral screening digital craniocaudal and mediolateral oblique mammograms were obtained. Bilateral screening digital breast tomosynthesis was performed. The images were evaluated with computer-aided detection. COMPARISON:  Previous exam(s). ACR Breast Density Category c: The breasts are heterogeneously dense, which may obscure small masses. FINDINGS: In the left breast, possible distortion warrants further evaluation. In the right breast, no findings suspicious for malignancy. IMPRESSION: Further evaluation is suggested for possible distortion in the left breast. RECOMMENDATION: Diagnostic mammogram and possibly ultrasound of the left breast. (Code:[id number]-L-[id number]) The patient will be contacted regarding the findings, and additional imaging will be scheduled. BI-RADS CATEGORY  0: Incomplete: Need additional imaging evaluation. Electronically Signed   By: Norleen Croak  M.D.   On: 10/02/2023 07:55    Assessment and plan- Patient is a 63 y.o. female with newly diagnosed clinical prognostic stage I invasive mammary carcinoma of the left breast cT1b N0 M0 ER/PR positive HER2 negative here to discuss further management  I have discussed mammogram and ultrasound results with the patient in detail which shows a 1 cm mass in the left breast at the 2 o'clock position 5 cm from the nipple.  No abnormal appearing left axillary lymph nodes.  This was biopsied and was consistent with 1 cm grade 2 invasive mammary carcinoma grade 2 ER 95% positive PR 100% positive HER2 negative +1 with a Ki-67 of 5%.  Overall this is a biologically favorable subtype of breast cancer and I would recommend upfront lumpectomy and sentinel lymph node biopsy as the first step.  I will see her after final pathology results are back.  Discussed what Oncotype testing is and how the results are interpreted.  Oncotype testing would be indicated for her and if she falls in the high risk group with a recurrence score of 26 or higher she would benefit from adjuvant chemotherapy.  At her age if her score is 25 or lower she would not benefit from adjuvant chemotherapy.  Adjuvant radiation therapy would be indicated after lumpectomy.  Given that her tumor is ER/PR positive she would also benefit from adjuvant endocrine therapy which I will discuss with her in greater detail down the line.  Treatment will be given with a curative intent.  I am also referring her for genetic testing at this time   Cancer Staging  Malignant neoplasm of upper-outer quadrant of left breast in female, estrogen receptor positive (HCC) Staging form: Breast, AJCC 8th Edition - Clinical stage from 10/22/2023: Stage IA (cT1b, cN0, cM0, G2, ER+, PR+, HER2-) - Signed by Melanee Annah BROCKS, MD on 10/22/2023 Histopathologic type: Adenocarcinoma, NOS Stage prefix: Initial diagnosis Histologic grading system: 3 grade system   Thank you for this  kind referral and the opportunity to participate in  the care of this patient   Visit Diagnosis 1. Malignant neoplasm of upper-outer quadrant of left breast in female, estrogen receptor positive (HCC)   2. Goals of care, counseling/discussion     Dr. Annah Skene, MD, MPH [facility] at [facility] [phone] 10/22/2023

## 2023-10-23 ENCOUNTER — Other Ambulatory Visit: Payer: Self-pay

## 2023-10-23 DIAGNOSIS — Z01818 Encounter for other preprocedural examination: Secondary | ICD-10-CM

## 2023-10-25 ENCOUNTER — Encounter: Payer: Self-pay | Admitting: General Surgery

## 2023-10-25 ENCOUNTER — Other Ambulatory Visit: Payer: Self-pay

## 2023-10-25 ENCOUNTER — Ambulatory Visit
Admission: RE | Admit: 2023-10-25 | Discharge: 2023-10-25 | Disposition: A | Discharge status: Home / Self Care | Source: Ambulatory Visit | Attending: General Surgery | Admitting: General Surgery

## 2023-10-25 ENCOUNTER — Encounter
Admission: RE | Disposition: A | Discharge status: Home / Self Care | Payer: Self-pay | Source: Home / Self Care | Attending: General Surgery

## 2023-10-25 ENCOUNTER — Ambulatory Visit: Payer: Self-pay | Admitting: Urgent Care

## 2023-10-25 ENCOUNTER — Ambulatory Visit
Admission: RE | Admit: 2023-10-25 | Discharge: 2023-10-25 | Disposition: A | Discharge status: Home / Self Care | Attending: General Surgery | Admitting: General Surgery

## 2023-10-25 ENCOUNTER — Encounter: Payer: Self-pay | Admitting: Urgent Care

## 2023-10-25 ENCOUNTER — Ambulatory Visit

## 2023-10-25 DIAGNOSIS — E282 Polycystic ovarian syndrome: Secondary | ICD-10-CM | POA: Insufficient documentation

## 2023-10-25 DIAGNOSIS — Z1721 Progesterone receptor positive status: Secondary | ICD-10-CM | POA: Diagnosis not present

## 2023-10-25 DIAGNOSIS — C50412 Malignant neoplasm of upper-outer quadrant of left female breast: Secondary | ICD-10-CM | POA: Diagnosis not present

## 2023-10-25 DIAGNOSIS — E669 Obesity, unspecified: Secondary | ICD-10-CM | POA: Insufficient documentation

## 2023-10-25 DIAGNOSIS — Z6834 Body mass index (BMI) 34.0-34.9, adult: Secondary | ICD-10-CM | POA: Insufficient documentation

## 2023-10-25 DIAGNOSIS — Z87891 Personal history of nicotine dependence: Secondary | ICD-10-CM | POA: Insufficient documentation

## 2023-10-25 DIAGNOSIS — I10 Essential (primary) hypertension: Secondary | ICD-10-CM | POA: Insufficient documentation

## 2023-10-25 DIAGNOSIS — Z809 Family history of malignant neoplasm, unspecified: Secondary | ICD-10-CM | POA: Insufficient documentation

## 2023-10-25 DIAGNOSIS — Z79899 Other long term (current) drug therapy: Secondary | ICD-10-CM | POA: Insufficient documentation

## 2023-10-25 DIAGNOSIS — Z1732 Human epidermal growth factor receptor 2 negative status: Secondary | ICD-10-CM | POA: Diagnosis not present

## 2023-10-25 DIAGNOSIS — Z170 Estrogen receptor positive status [ER+]: Secondary | ICD-10-CM | POA: Insufficient documentation

## 2023-10-25 DIAGNOSIS — E039 Hypothyroidism, unspecified: Secondary | ICD-10-CM | POA: Diagnosis not present

## 2023-10-25 DIAGNOSIS — C50912 Malignant neoplasm of unspecified site of left female breast: Secondary | ICD-10-CM | POA: Diagnosis not present

## 2023-10-25 DIAGNOSIS — Z01810 Encounter for preprocedural cardiovascular examination: Secondary | ICD-10-CM

## 2023-10-25 LAB — CBC WITH DIFFERENTIAL/PLATELET
Basophils Absolute: 0.1 x10E3/uL (ref 0.0–0.2)
Basos: 1 %
EOS (ABSOLUTE): 0.2 x10E3/uL (ref 0.0–0.4)
Eos: 3 %
Hematocrit: 40.3 % (ref 34.0–46.6)
Hemoglobin: 13 g/dL (ref 11.1–15.9)
Immature Grans (Abs): 0 x10E3/uL (ref 0.0–0.1)
Immature Granulocytes: 0 %
Lymphocytes Absolute: 2 x10E3/uL (ref 0.7–3.1)
Lymphs: 32 %
MCH: 28.6 pg (ref 26.6–33.0)
MCHC: 32.3 g/dL (ref 31.5–35.7)
MCV: 89 fL (ref 79–97)
Monocytes Absolute: 0.4 x10E3/uL (ref 0.1–0.9)
Monocytes: 6 %
Neutrophils Absolute: 3.7 x10E3/uL (ref 1.4–7.0)
Neutrophils: 58 %
Platelets: 326 x10E3/uL (ref 150–450)
RBC: 4.55 x10E6/uL (ref 3.77–5.28)
RDW: 16.2 % — ABNORMAL HIGH (ref 11.7–15.4)
WBC: 6.3 x10E3/uL (ref 3.4–10.8)

## 2023-10-25 LAB — BASIC METABOLIC PANEL WITH GFR
BUN/Creatinine Ratio: 16 (ref 12–28)
BUN: 15 mg/dL (ref 8–27)
CO2: 20 mmol/L (ref 20–29)
Calcium: 9.6 mg/dL (ref 8.7–10.3)
Chloride: 106 mmol/L (ref 96–106)
Creatinine, Ser: 0.92 mg/dL (ref 0.57–1.00)
Glucose: 105 mg/dL — ABNORMAL HIGH (ref 70–99)
Potassium: 4.2 mmol/L (ref 3.5–5.2)
Sodium: 141 mmol/L (ref 134–144)
eGFR: 71 mL/min/1.73 (ref >59)

## 2023-10-25 SURGERY — BREAST LUMPECTOMY WITH RADIO FREQUENCY LOCALIZER
Anesthesia: General | Site: Breast | Laterality: Left

## 2023-10-25 MED ORDER — BUPIVACAINE-EPINEPHRINE (PF) 0.5% -1:200000 IJ SOLN
INTRAMUSCULAR | Status: DC | PRN
  Administered 2023-10-25: 30 mL via PERINEURAL

## 2023-10-25 MED ORDER — HYDROCODONE-ACETAMINOPHEN 5-325 MG PO TABS
1.0000 | ORAL_TABLET | Freq: Four times a day (QID) | ORAL | 0 refills | Status: AC | PRN
  Filled 2023-10-25: qty 12, 3d supply, fill #0

## 2023-10-25 MED ORDER — LACTATED RINGERS IV SOLN: INTRAVENOUS | Status: DC

## 2023-10-25 MED ORDER — CEFAZOLIN SODIUM-DEXTROSE 2-4 GM/100ML-% IV SOLN
2.0000 g | INTRAVENOUS | Status: AC
  Administered 2023-10-25: 2 g via INTRAVENOUS

## 2023-10-25 MED ORDER — ACETAMINOPHEN 10 MG/ML IV SOLN
INTRAVENOUS | Status: DC | PRN
Start: 2023-10-25 — End: 2023-10-25
  Administered 2023-10-25: 1000 mg via INTRAVENOUS

## 2023-10-25 MED ORDER — BUPIVACAINE-EPINEPHRINE (PF) 0.5% -1:200000 IJ SOLN
INTRAMUSCULAR | Status: AC
  Filled 2023-10-25: qty 30

## 2023-10-25 MED ORDER — TECHNETIUM TC 99M TILMANOCEPT KIT
1.1500 | PACK | Freq: Once | INTRAVENOUS | Status: AC | PRN
  Administered 2023-10-25: 1.15 via INTRADERMAL

## 2023-10-25 MED ORDER — ACETAMINOPHEN 10 MG/ML IV SOLN
INTRAVENOUS | Status: AC
  Filled 2023-10-25: qty 100

## 2023-10-25 MED ORDER — MIDAZOLAM HCL 2 MG/2ML IJ SOLN
INTRAMUSCULAR | Status: AC
  Filled 2023-10-25: qty 2

## 2023-10-25 MED ORDER — FENTANYL CITRATE (PF) 100 MCG/2ML IJ SOLN
INTRAMUSCULAR | Status: AC
  Filled 2023-10-25: qty 2

## 2023-10-25 MED ORDER — CEFAZOLIN SODIUM-DEXTROSE 2-4 GM/100ML-% IV SOLN
INTRAVENOUS | Status: AC
Start: 2023-10-25 — End: 2023-10-25
  Filled 2023-10-25: qty 100

## 2023-10-25 MED ORDER — FENTANYL CITRATE (PF) 100 MCG/2ML IJ SOLN: 25.0000 ug | INTRAMUSCULAR | Status: DC | PRN

## 2023-10-25 MED ORDER — STERILE WATER FOR IRRIGATION IR SOLN
Status: DC | PRN
  Administered 2023-10-25: 500 mL

## 2023-10-25 MED ORDER — DEXAMETHASONE SODIUM PHOSPHATE 10 MG/ML IJ SOLN
INTRAMUSCULAR | Status: DC | PRN
  Administered 2023-10-25: 10 mg via INTRAVENOUS

## 2023-10-25 MED ORDER — LIDOCAINE HCL (CARDIAC) PF 100 MG/5ML IV SOSY
PREFILLED_SYRINGE | INTRAVENOUS | Status: DC | PRN
Start: 2023-10-25 — End: 2023-10-25
  Administered 2023-10-25: 100 mg via INTRAVENOUS

## 2023-10-25 MED ORDER — METHYLENE BLUE 20 MG/2ML IV SOSY
PREFILLED_SYRINGE | INTRAVENOUS | Status: AC
  Filled 2023-10-25: qty 2

## 2023-10-25 MED ORDER — CHLORHEXIDINE GLUCONATE 0.12 % MT SOLN
15.0000 mL | Freq: Once | OROMUCOSAL | Status: AC
  Administered 2023-10-25: 15 mL via OROMUCOSAL

## 2023-10-25 MED ORDER — LIDOCAINE HCL (PF) 2 % IJ SOLN
INTRAMUSCULAR | Status: AC
  Filled 2023-10-25: qty 5

## 2023-10-25 MED ORDER — MIDAZOLAM HCL 2 MG/2ML IJ SOLN
INTRAMUSCULAR | Status: DC | PRN
Start: 2023-10-25 — End: 2023-10-25
  Administered 2023-10-25: 2 mg via INTRAVENOUS

## 2023-10-25 MED ORDER — ACETAMINOPHEN 10 MG/ML IV SOLN
1000.0000 mg | Freq: Once | INTRAVENOUS | Status: DC | PRN
Start: 2023-10-25 — End: 2023-10-25

## 2023-10-25 MED ORDER — OXYCODONE HCL 5 MG/5ML PO SOLN: 5.0000 mg | Freq: Once | ORAL | Status: DC | PRN

## 2023-10-25 MED ORDER — PROPOFOL 10 MG/ML IV BOLUS
INTRAVENOUS | Status: DC | PRN
Start: 2023-10-25 — End: 2023-10-25
  Administered 2023-10-25: 170 mg via INTRAVENOUS

## 2023-10-25 MED ORDER — ORAL CARE MOUTH RINSE: 15.0000 mL | Freq: Once | OROMUCOSAL | Status: AC

## 2023-10-25 MED ORDER — DEXAMETHASONE SODIUM PHOSPHATE 10 MG/ML IJ SOLN
INTRAMUSCULAR | Status: AC
  Filled 2023-10-25: qty 1

## 2023-10-25 MED ORDER — FENTANYL CITRATE (PF) 100 MCG/2ML IJ SOLN
INTRAMUSCULAR | Status: DC | PRN
  Administered 2023-10-25 (×2): 25 ug via INTRAVENOUS
  Administered 2023-10-25: 50 ug via INTRAVENOUS

## 2023-10-25 MED ORDER — ONDANSETRON HCL 4 MG/2ML IJ SOLN: 4.0000 mg | Freq: Once | INTRAMUSCULAR | Status: DC | PRN

## 2023-10-25 MED ORDER — ONDANSETRON HCL 4 MG/2ML IJ SOLN
INTRAMUSCULAR | Status: AC
  Filled 2023-10-25: qty 2

## 2023-10-25 MED ORDER — ONDANSETRON HCL 4 MG/2ML IJ SOLN
INTRAMUSCULAR | Status: DC | PRN
Start: 2023-10-25 — End: 2023-10-25
  Administered 2023-10-25: 4 mg via INTRAVENOUS

## 2023-10-25 MED ORDER — PHENYLEPHRINE 80 MCG/ML (10ML) SYRINGE FOR IV PUSH (FOR BLOOD PRESSURE SUPPORT): PREFILLED_SYRINGE | INTRAVENOUS | Status: DC | PRN

## 2023-10-25 MED ORDER — PROPOFOL 10 MG/ML IV BOLUS
INTRAVENOUS | Status: AC
  Filled 2023-10-25: qty 20

## 2023-10-25 MED ORDER — OXYCODONE HCL 5 MG PO TABS: 5.0000 mg | ORAL_TABLET | Freq: Once | ORAL | Status: DC | PRN

## 2023-10-25 MED ORDER — CHLORHEXIDINE GLUCONATE 0.12 % MT SOLN
OROMUCOSAL | Status: AC
Start: 2023-10-25 — End: 2023-10-25
  Filled 2023-10-25: qty 15

## 2023-10-25 SURGICAL SUPPLY — 30 items
BLADE SURG 15 STRL LF DISP TIS (BLADE) ×1 IMPLANT
CHLORAPREP W/TINT 26 (MISCELLANEOUS) IMPLANT
COVER PROBE GAMMA FINDER SLV (MISCELLANEOUS) ×1 IMPLANT
DERMABOND ADVANCED .7 DNX12 (GAUZE/BANDAGES/DRESSINGS) ×1 IMPLANT
DEVICE DUBIN SPECIMEN MAMMOGRA (MISCELLANEOUS) ×1 IMPLANT
DRAPE LAPAROTOMY 77X122 PED (DRAPES) ×1 IMPLANT
ELECTRODE REM PT RTRN 9FT ADLT (ELECTROSURGICAL) ×1 IMPLANT
GLOVE BIO SURGEON STRL SZ 6.5 (GLOVE) ×1 IMPLANT
GLOVE BIOGEL PI IND STRL 6.5 (GLOVE) ×1 IMPLANT
GLOVE SURG SYN 6.5 PF PI (GLOVE) ×2 IMPLANT
GOWN STRL REUS W/ TWL LRG LVL3 (GOWN DISPOSABLE) ×4 IMPLANT
KIT MARKER MARGIN INK (KITS) IMPLANT
KIT TURNOVER KIT A (KITS) ×1 IMPLANT
LABEL OR SOLS (LABEL) ×1 IMPLANT
MANIFOLD NEPTUNE II (INSTRUMENTS) ×1 IMPLANT
NDL HYPO 22X1.5 SAFETY MO (MISCELLANEOUS) ×1 IMPLANT
NDL SAFETY ECLIP 18X1.5 (MISCELLANEOUS) IMPLANT
NEEDLE HYPO 22X1.5 SAFETY MO (MISCELLANEOUS) IMPLANT
PACK BASIN MINOR ARMC (MISCELLANEOUS) ×1 IMPLANT
RETRACTOR RING XSMALL (MISCELLANEOUS) IMPLANT
SHEATH BREAST BIOPSY SKIN MKR (SHEATH) ×1 IMPLANT
SOLN STERILE WATER 1000 ML (IV SOLUTION) ×1 IMPLANT
SOLN STERILE WATER BTL 1000 ML (IV SOLUTION) ×1 IMPLANT
SUT SILK 2 0 SH (SUTURE) IMPLANT
SUT VIC AB 3-0 SH 27X BRD (SUTURE) ×1 IMPLANT
SUTURE MNCRL 4-0 27XMF (SUTURE) ×1 IMPLANT
SYR 10ML LL (SYRINGE) ×1 IMPLANT
SYR BULB IRRIG 60ML STRL (SYRINGE) ×1 IMPLANT
TRAP FLUID SMOKE EVACUATOR (MISCELLANEOUS) ×1 IMPLANT
TRAP NEPTUNE SPECIMEN COLLECT (MISCELLANEOUS) ×1 IMPLANT

## 2023-10-25 NOTE — Anesthesia Preprocedure Evaluation (Addendum)
 Anesthesia Evaluation  Patient identified by MRN, date of birth, ID band Patient awake    Reviewed: Allergy & Precautions, NPO status , Patient's Chart, lab work & pertinent test results  History of Anesthesia Complications Negative for: history of anesthetic complications  Airway Mallampati: III   Neck ROM: Full    Dental  (+) Missing   Pulmonary former smoker (quit 2001)   Pulmonary exam normal breath sounds clear to auscultation       Cardiovascular hypertension, Normal cardiovascular exam Rhythm:Regular Rate:Normal  ECG 10/23/23: normal   Neuro/Psych negative neurological ROS     GI/Hepatic negative GI ROS,,,  Endo/Other  Hypothyroidism  Obesity; PCOS  Renal/GU negative Renal ROS     Musculoskeletal   Abdominal   Peds  Hematology  (+) Blood dyscrasia, anemia   Anesthesia Other Findings Last dose of Wegovy  2 weeks ago.  Reproductive/Obstetrics                              Anesthesia Physical Anesthesia Plan  ASA: 2  Anesthesia Plan: General   Post-op Pain Management:    Induction: Intravenous  PONV Risk Score and Plan: 3 and Ondansetron, Dexamethasone and Treatment may vary due to age or medical condition  Airway Management Planned: LMA  Additional Equipment:   Intra-op Plan:   Post-operative Plan: Extubation in OR  Informed Consent: I have reviewed the patients History and Physical, chart, labs and discussed the procedure including the risks, benefits and alternatives for the proposed anesthesia with the patient or authorized representative who has indicated his/her understanding and acceptance.     Dental advisory given  Plan Discussed with: CRNA  Anesthesia Plan Comments: (Patient consented for risks of anesthesia including but not limited to:  - adverse reactions to medications - damage to eyes, teeth, lips or other oral mucosa - nerve damage due to  positioning  - sore throat or hoarseness - damage to heart, brain, nerves, lungs, other parts of body or loss of life  Informed patient about role of CRNA in peri- and intra-operative care.  Patient voiced understanding.)         Anesthesia Quick Evaluation

## 2023-10-25 NOTE — Anesthesia Procedure Notes (Signed)
 Procedure Name: LMA Insertion Date/Time: 10/25/2023 11:16 AM  Performed by: Dominica Krabbe, CRNAPre-anesthesia Checklist: Patient identified, Emergency Drugs available, Suction available, Patient being monitored and Timeout performed Patient Re-evaluated:Patient Re-evaluated prior to induction Oxygen Delivery Method: Circle system utilized Preoxygenation: Pre-oxygenation with 100% oxygen Induction Type: IV induction LMA: LMA inserted LMA Size: 4.0 Tube type: Oral Number of attempts: 1 Placement Confirmation: positive ETCO2 and breath sounds checked- equal and bilateral Tube secured with: Tape Dental Injury: Teeth and Oropharynx as per pre-operative assessment

## 2023-10-25 NOTE — Anesthesia Postprocedure Evaluation (Signed)
 Anesthesia Post Note  Patient: Gail Hunter  Procedure(s) Performed: BREAST LUMPECTOMY WITH RADIO FREQUENCY LOCALIZER (Left: Breast) BIOPSY, LYMPH NODE, SENTINEL, AXILLARY (Left: Breast)  Patient location during evaluation: PACU Anesthesia Type: General Level of consciousness: awake and alert, oriented and patient cooperative Pain management: pain level controlled Vital Signs Assessment: post-procedure vital signs reviewed and stable Respiratory status: spontaneous breathing, nonlabored ventilation and respiratory function stable Cardiovascular status: blood pressure returned to baseline and stable Postop Assessment: adequate PO intake Anesthetic complications: no   No notable events documented.   Last Vitals:  Vitals:   10/25/23 1302 10/25/23 1306  BP: 135/76   Pulse: 93 80  Resp: 14 14  Temp:  36.4 C  SpO2: 97% 96%    Last Pain:  Vitals:   10/25/23 1306  TempSrc:   PainSc: 0-No pain                 Alfonso Ruths

## 2023-10-25 NOTE — Op Note (Signed)
 Preoperative diagnosis: Left breast carcinoma.  Postoperative diagnosis: Same.   Procedure: Left radiofrequency tag-localized partial mastectomy.                      Left Axillary Sentinel Lymph node biopsy  Anesthesia: GETA  Surgeon: Dr. Cesar Coe  Wound Classification: Clean  Indications: Patient is a 63 y.o. female with a nonpalpable left breast mass noted on mammography with core biopsy demonstrating invasive mammary carcinoma requires radiofrequency tag-localized partial mastectomy for treatment with sentinel lymph node biopsy.   Findings: 1. Specimen mammography shows marker and tag on specimen 2. Pathology call refers gross examination of margins was grossly negative 3. No other palpable mass or lymph node identified.   Description of procedure: Preoperative radiofrequency tag localization was performed by radiology. In the nuclear medicine suite, the subareolar region was injected with Tc-99 sulfur colloid. Localization studies were reviewed. The patient was taken to the operating room and placed supine on the operating table, and after general anesthesia the left chest and axilla were prepped and draped in the usual sterile fashion. A time-out was completed verifying correct patient, procedure, site, positioning, and implant(s) and/or special equipment prior to beginning this procedure.  By comparing the localization studies and interrogation with [facility] device, the probable trajectory and location of the mass was visualized. A circumareolar skin incision was planned in such a way as to minimize the amount of dissection to reach the mass.  The skin incision was made. Flaps were raised and the location of the tag was confirmed with [facility] device confirmed. A 2-0 silk figure-of-eight stay suture was placed and used for retraction. Dissection was then taken down circumferentially, taking care to include the entire localizing tag and a wide margin of grossly normal tissue. The  specimen and entire localizing tag were removed. The specimen was oriented and sent to radiology with the localization studies. Confirmation was received that the entire target lesion had been resected. The wound was irrigated. Hemostasis was checked. The wound was closed with interrupted sutures of 3-0 Vicryl and a subcuticular suture of Monocryl 3-0. No attempt was made to close the dead space.   A hand-held gamma probe was used to identify the location of the hottest spot in the axilla. An incision was made around the caudal axillary hairline. Dissection was carried down until subdermal facias was advanced. The probe was placed and again, the point of maximal count was found. Dissection continue until nodule was identified. The probe was placed in contact with the node. The node was excised in its entirety.  An additional hot spot was detected and the node was excised in similar fashion. No additional hot spots were identified. No clinically abnormal nodes were palpated. The procedure was terminated. Hemostasis was achieved and the wound closed in layers with deep interrupted 3-0 Vicryl and skin was closed with subcuticular suture of Monocryl 3-0.  The patient tolerated the procedure well and was taken to the postanesthesia care unit in stable condition.   Sentinel Node Biopsy Synoptic Operative Report  Operation performed with curative intent:Yes  Tracer(s) used to identify sentinel nodes in the upfront surgery (non-neoadjuvant) setting (select all that apply):Radioactive Tracer  Tracer(s) used to identify sentinel nodes in the neoadjuvant setting (select all that apply):N/A  All nodes (colored or non-colored) present at the end of a dye-filled lymphatic channel were removed:N/A  All significantly radioactive nodes were removed:Yes  All palpable suspicious nodes were removed:N/A  Biopsy-proven positive nodes marked  with clips prior to chemotherapy were identified and removed:N/A  Specimen:  Left Breast mass                     Left axillary Sentinel Lymph nodes #1, #2  Complications: None  Estimated Blood Loss: 10 mL

## 2023-10-25 NOTE — Discharge Instructions (Signed)

## 2023-10-25 NOTE — Transfer of Care (Signed)
 Immediate Anesthesia Transfer of Care Note  Patient: Gail Hunter  Procedure(s) Performed: BREAST LUMPECTOMY WITH RADIO FREQUENCY LOCALIZER (Left: Breast) BIOPSY, LYMPH NODE, SENTINEL, AXILLARY (Left: Breast)  Patient Location: PACU  Anesthesia Type:General  Level of Consciousness: drowsy and patient cooperative  Airway & Oxygen Therapy: Patient Spontanous Breathing and Patient connected to nasal cannula oxygen  Post-op Assessment: Report given to RN and Post -op Vital signs reviewed and stable  Post vital signs: Reviewed and stable  Last Vitals:  Vitals Value Taken Time  BP 137/70 10/25/23 12:36  Temp    Pulse 91 10/25/23 12:36  Resp    SpO2 99 % 10/25/23 12:36  Vitals shown include unfiled device data.  Last Pain:  Vitals:   10/25/23 1236  TempSrc: Tympanic  PainSc: 0-No pain      Patients Stated Pain Goal: 0 (10/25/23 1004)  Complications: No notable events documented.

## 2023-10-25 NOTE — Interval H&P Note (Signed)
 History and Physical Interval Note:  10/25/2023 11:07 AM  Gail Hunter  has presented today for surgery, with the diagnosis of C50.412 Z17.0 malignant neoplasm of upper-outer quadrant of lt breast in female estrogen receptor positive.  The various methods of treatment have been discussed with the patient and family. After consideration of risks, benefits and other options for treatment, the patient has consented to  Procedure(s): BREAST LUMPECTOMY WITH RADIO FREQUENCY LOCALIZER (Left) BIOPSY, LYMPH NODE, SENTINEL, AXILLARY (Left) as a surgical intervention.  The patient's history has been reviewed, patient examined, no change in status, stable for surgery.  I have reviewed the patient's chart and labs.  Questions were answered to the patient's satisfaction.     Lucas Sjogren

## 2023-10-26 ENCOUNTER — Encounter: Payer: Self-pay | Admitting: General Surgery

## 2023-10-30 ENCOUNTER — Encounter: Payer: Self-pay | Admitting: *Deleted

## 2023-10-30 LAB — SURGICAL PATHOLOGY

## 2023-10-30 NOTE — Progress Notes (Signed)
 Oncotype Dx order [id number] submitted online

## 2023-10-31 ENCOUNTER — Telehealth: Payer: Self-pay | Admitting: Licensed Clinical Social Worker

## 2023-10-31 ENCOUNTER — Encounter: Payer: Self-pay | Admitting: Licensed Clinical Social Worker

## 2023-10-31 ENCOUNTER — Ambulatory Visit: Payer: Self-pay | Admitting: Licensed Clinical Social Worker

## 2023-10-31 DIAGNOSIS — Z1379 Encounter for other screening for genetic and chromosomal anomalies: Secondary | ICD-10-CM | POA: Insufficient documentation

## 2023-10-31 DIAGNOSIS — C50912 Malignant neoplasm of unspecified site of left female breast: Secondary | ICD-10-CM

## 2023-10-31 NOTE — Progress Notes (Signed)
 HPI:   Gail Hunter was previously had genetic testing ordered on the date of her initial oncology visit due to her personal history of breast cancer.  Gail Hunter recent genetic test results were disclosed to her, as were recommendations warranted by these results. These results and recommendations are discussed in more detail below.  CANCER HISTORY:  Oncology History  Malignant neoplasm of upper-outer quadrant of left breast in female, estrogen receptor positive (HCC)  10/22/2023 Initial Diagnosis   Malignant neoplasm of upper-outer quadrant of left breast in female, estrogen receptor positive (HCC)   10/22/2023 Cancer Staging   Staging form: Breast, AJCC 8th Edition - Clinical stage from 10/22/2023: Stage IA (cT1b, cN0, cM0, G2, ER+, PR+, HER2-) - Signed by Melanee Annah BROCKS, MD on 10/22/2023 Histopathologic type: Adenocarcinoma, NOS Stage prefix: Initial diagnosis Histologic grading system: 3 grade system   10/28/2023 Genetic Testing   Negative genetic testing. No pathogenic variants identified on the [facility] CancerNext-Expanded+RNA Panel. The report date is 10/28/2023.  The CancerNext-Expanded gene panel offered by [facility] and includes sequencing, rearrangement, and RNA analysis for the following 77 genes: AIP, ALK, APC, ATM, AXIN2, BAP1, BARD1, BMPR1A, BRCA1, BRCA2, BRIP1, CDC73, CDH1, CDK4, CDKN1B, CDKN2A, CEBPA, CHEK2, CTNNA1, DDX41, DICER1, ETV6, FH, FLCN, GATA2, LZTR1, MAX, MBD4, MEN1, MET, MLH1, MSH2, MSH3, MSH6, MUTYH, NF1, NF2, NTHL1, PALB2, PHOX2B, PMS2, POT1, PRKAR1A, PTCH1, PTEN, RAD51C, RAD51D, RB1, RET, RPS20, RUNX1, SDHA, SDHAF2, SDHB, SDHC, SDHD, SMAD4, SMARCA4, SMARCB1, SMARCE1, STK11, SUFU, TMEM127, TP53, TSC1, TSC2, VHL, and WT1 (sequencing and deletion/duplication); EGFR, HOXB13, KIT, MITF, PDGFRA, POLD1, and POLE (sequencing only); EPCAM and GREM1 (deletion/duplication only).       Past Medical History:  Diagnosis Date   Allergy    Anemia    History of chicken pox     History of measles    Hyperlipidemia    Hypertension    Thyroid disease     Past Surgical History:  Procedure Laterality Date   AXILLARY SENTINEL NODE BIOPSY Left 10/25/2023   Procedure: BIOPSY, LYMPH NODE, SENTINEL, AXILLARY;  Surgeon: Rodolph Romano, MD;  Location: [facility] ORS;  Service: General;  Laterality: Left;   BREAST BIOPSY Left 10/09/2023   US  LT BREAST BX W LOC DEV 1ST LESION IMG BX SPEC US  GUIDE 10/09/2023 [facility]   BREAST LUMPECTOMY WITH RADIO FREQUENCY LOCALIZER Left 10/25/2023   Procedure: BREAST LUMPECTOMY WITH RADIO FREQUENCY LOCALIZER;  Surgeon: Rodolph Romano, MD;  Location: [facility] ORS;  Service: General;  Laterality: Left;   CESAREAN SECTION     CHOLECYSTECTOMY  02/2002   LAPAROSCOPIC ABDOMINAL EXPLORATION      FAMILY HISTORY:  We obtained a detailed, 4-generation family history.  Significant diagnoses are listed below: Family History  Problem Relation Age of Onset   Alzheimer's disease Mother    Thyroid disease Mother    Alzheimer's disease Father    Diabetes Sister    Hyperlipidemia Brother    Breast cancer Other        mat great uncle      Gail Hunter is unaware of previous family history of genetic testing for hereditary cancer risks.   GENETIC COUNSELING ASSESSMENT: Gail Hunter is a 63 y.o. female with a personal history of breast cancer which is somewhat suggestive of a hereditary cancer syndrome and predisposition to cancer. We, therefore, discussed and recommended the following.   DISCUSSION: We discussed that, in general, most cancer is not inherited in families, but instead is sporadic or familial. Approximately 10% of breast cancer is hereditary. There  are other genes associated with other types of cancer as well. Cancers and risks are gene specific. We discussed that testing is beneficial for several reasons including knowing about cancer risks, identifying potential screening and risk-reduction options that may be appropriate,  and to understand if other family members could be at risk for cancer and allow them to undergo genetic testing. Gail Hunter had her blood drawn for genetic testing and sent to [facility] for the CancerNext-Expanded+RNA panel on 10/17/2023.  GENETIC TEST RESULTS:  The [facility] CancerNext-Expanded+RNA Panel found no pathogenic mutations.   The CancerNext-Expanded gene panel offered by [facility] and includes sequencing, rearrangement, and RNA analysis for the following 77 genes: AIP, ALK, APC, ATM, AXIN2, BAP1, BARD1, BMPR1A, BRCA1, BRCA2, BRIP1, CDC73, CDH1, CDK4, CDKN1B, CDKN2A, CEBPA, CHEK2, CTNNA1, DDX41, DICER1, ETV6, FH, FLCN, GATA2, LZTR1, MAX, MBD4, MEN1, MET, MLH1, MSH2, MSH3, MSH6, MUTYH, NF1, NF2, NTHL1, PALB2, PHOX2B, PMS2, POT1, PRKAR1A, PTCH1, PTEN, RAD51C, RAD51D, RB1, RET, RPS20, RUNX1, SDHA, SDHAF2, SDHB, SDHC, SDHD, SMAD4, SMARCA4, SMARCB1, SMARCE1, STK11, SUFU, TMEM127, TP53, TSC1, TSC2, VHL, and WT1 (sequencing and deletion/duplication); EGFR, HOXB13, KIT, MITF, PDGFRA, POLD1, and POLE (sequencing only); EPCAM and GREM1 (deletion/duplication only).   The test report has been scanned into EPIC and is located under the Molecular Pathology section of the Results Review tab.  A portion of the result report is included below for reference. Genetic testing reported out on 10/28/2023.       Even though a pathogenic variant was not identified, possible explanations for the cancer in the family may include: There may be no hereditary risk for cancer in the family. The cancers in Gail Hunter and/or her family may be sporadic/familial or due to other genetic and environmental factors. There may be a gene mutation in one of these genes that current testing methods cannot detect but that chance is small. There could be another gene that has not yet been discovered, or that we have not yet tested, that is responsible for the cancer diagnoses in the family.  It is also possible there is a hereditary  cause for the cancer in the family that Gail Hunter did not inherit.  Therefore, it is important to remain in touch with cancer genetics in the future so that we can continue to offer Gail Hunter the most up to date genetic testing.   ADDITIONAL GENETIC TESTING:  We discussed with Gail Hunter that her genetic testing was fairly extensive.  If there are additional relevant genes identified to increase cancer risk that can be analyzed in the future, we would be happy to discuss and coordinate this testing at that time.    CANCER SCREENING RECOMMENDATIONS:  Gail Hunter test result is considered negative (normal).  This means that we have not identified a hereditary cause for her personal and family history of cancer at this time.   An individual's cancer risk and medical management are not determined by genetic test results alone. Overall cancer risk assessment incorporates additional factors, including personal medical history, family history, and any available genetic information that may result in a personalized plan for cancer prevention and surveillance. Therefore, it is recommended she continue to follow the cancer management and screening guidelines provided by her oncology and primary healthcare provider.  RECOMMENDATIONS FOR FAMILY MEMBERS:   Since she did not inherit a identifiable mutation in a cancer predisposition gene included on this panel, her children could not have inherited a known mutation from her in one of these genes. Individuals in  this family might be at some increased risk of developing cancer, over the general population risk, due to the family history of cancer.  Individuals in the family should notify their providers of the family history of cancer. We recommend women in this family have a yearly mammogram beginning at age 48, or 55 years younger than the earliest onset of cancer, an annual clinical breast exam, and perform monthly breast self-exams.  Family members should  have colonoscopies by at age 37, or earlier, as recommended by their providers.  FOLLOW-UP:  Lastly, we discussed with Gail Hunter that cancer genetics is a rapidly advancing field and it is possible that new genetic tests will be appropriate for her and/or her family members in the future. We encouraged her to remain in contact with cancer genetics on an annual basis so we can update her personal and family histories and let her know of advances in cancer genetics that may benefit this family.   Our contact number was provided. Gail Hunter questions were answered to her satisfaction, and she knows she is welcome to call us  at anytime with additional questions or concerns.    Dena Cary, MS, [facility] Genetic Counselor [email or web address] Phone: [phone]

## 2023-10-31 NOTE — Telephone Encounter (Signed)
 I contacted Ms. Buchholz to discuss her genetic testing results. No pathogenic variants were identified in the 77 genes analyzed. Detailed clinic note to follow.   The test report has been scanned into EPIC and is located under the Molecular Pathology section of the Results Review tab.  A portion of the result report is included below for reference.      Dena Cary, MS, [facility] Genetic Counselor [email or web address] Phone: [phone]

## 2023-11-01 ENCOUNTER — Inpatient Hospital Stay: Attending: Oncology | Admitting: Hospice and Palliative Medicine

## 2023-11-01 DIAGNOSIS — Z79899 Other long term (current) drug therapy: Secondary | ICD-10-CM | POA: Insufficient documentation

## 2023-11-01 DIAGNOSIS — E785 Hyperlipidemia, unspecified: Secondary | ICD-10-CM | POA: Insufficient documentation

## 2023-11-01 DIAGNOSIS — Z79890 Hormone replacement therapy (postmenopausal): Secondary | ICD-10-CM | POA: Insufficient documentation

## 2023-11-01 DIAGNOSIS — Z17411 Hormone receptor positive with human epidermal growth factor receptor 2 negative status: Secondary | ICD-10-CM | POA: Insufficient documentation

## 2023-11-01 DIAGNOSIS — Z87891 Personal history of nicotine dependence: Secondary | ICD-10-CM | POA: Insufficient documentation

## 2023-11-01 DIAGNOSIS — Z803 Family history of malignant neoplasm of breast: Secondary | ICD-10-CM | POA: Insufficient documentation

## 2023-11-01 DIAGNOSIS — Z170 Estrogen receptor positive status [ER+]: Secondary | ICD-10-CM | POA: Insufficient documentation

## 2023-11-01 DIAGNOSIS — C50912 Malignant neoplasm of unspecified site of left female breast: Secondary | ICD-10-CM

## 2023-11-01 DIAGNOSIS — E079 Disorder of thyroid, unspecified: Secondary | ICD-10-CM | POA: Insufficient documentation

## 2023-11-01 DIAGNOSIS — I10 Essential (primary) hypertension: Secondary | ICD-10-CM | POA: Insufficient documentation

## 2023-11-01 DIAGNOSIS — C50412 Malignant neoplasm of upper-outer quadrant of left female breast: Secondary | ICD-10-CM | POA: Insufficient documentation

## 2023-11-01 NOTE — Progress Notes (Signed)
 Multidisciplinary Oncology Council Documentation  Gail Hunter was presented by our [facility] on 11/01/2023, which included representatives from:  Palliative Care Dietitian  Physical/Occupational Therapist Nurse Navigator Genetics Social work Survivorship RN Financial Navigator Research RN   Gail Hunter currently presents with history of breast cancer  We reviewed previous medical and familial history, history of present illness, and recent lab results along with all available histopathologic and imaging studies. The MOC considered available treatment options and made the following recommendations/referrals:  Rehab screening, genetics, SW  The MOC is a meeting of clinicians from various specialty areas who evaluate and discuss patients for whom a multidisciplinary approach is being considered. Final determinations in the plan of care are those of the provider(s).   Today's extended care, comprehensive team conference, Gail Hunter was not present for the discussion and was not examined.

## 2023-11-02 ENCOUNTER — Inpatient Hospital Stay

## 2023-11-02 NOTE — Progress Notes (Signed)
 [facility] Clinical Social Work  Clinical Social Work was referred by [facility] for assessment of psychosocial needs.  Clinical Social Worker contacted patient by phone to offer support and assess for needs.     Interventions: Provided patient with information about CSW services.  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  Patient has her Advance Directives completed.  She expressed having a good support system and no other needs at this time.      Follow Up Plan:  CSW will follow-up with patient by phone     Macario CHRISTELLA Au, LCSW  Clinical Social Worker [facility]

## 2023-11-06 ENCOUNTER — Encounter: Payer: Self-pay | Admitting: *Deleted

## 2023-11-06 NOTE — Progress Notes (Signed)
 Oncotype Dx is still pending.   Dr. Melanee and Dr. Lenn appointments moved to next Wednesday.   Appt. Details given to Ms. Faulk.

## 2023-11-07 ENCOUNTER — Ambulatory Visit: Admitting: Radiation Oncology

## 2023-11-07 ENCOUNTER — Inpatient Hospital Stay: Admitting: Oncology

## 2023-11-08 ENCOUNTER — Encounter: Payer: Self-pay | Admitting: *Deleted

## 2023-11-08 ENCOUNTER — Ambulatory Visit: Payer: Self-pay | Admitting: Oncology

## 2023-11-08 ENCOUNTER — Encounter: Payer: Self-pay | Admitting: Oncology

## 2023-11-08 ENCOUNTER — Other Ambulatory Visit: Payer: Self-pay | Admitting: General Surgery

## 2023-11-08 DIAGNOSIS — Z170 Estrogen receptor positive status [ER+]: Secondary | ICD-10-CM | POA: Diagnosis not present

## 2023-11-08 DIAGNOSIS — C50412 Malignant neoplasm of upper-outer quadrant of left female breast: Secondary | ICD-10-CM | POA: Diagnosis not present

## 2023-11-08 DIAGNOSIS — C50911 Malignant neoplasm of unspecified site of right female breast: Secondary | ICD-10-CM

## 2023-11-08 NOTE — Progress Notes (Signed)
 Discussed oncotype result of 10 and Ms. Gail Hunter will not need chemotherapy.   She will see Dr. Melanee and Dr. Lenn next week.

## 2023-11-09 ENCOUNTER — Ambulatory Visit
Admission: RE | Admit: 2023-11-09 | Discharge: 2023-11-09 | Disposition: A | Discharge status: Home / Self Care | Source: Ambulatory Visit | Attending: General Surgery | Admitting: General Surgery

## 2023-11-09 DIAGNOSIS — C50919 Malignant neoplasm of unspecified site of unspecified female breast: Secondary | ICD-10-CM | POA: Diagnosis not present

## 2023-11-09 DIAGNOSIS — R609 Edema, unspecified: Secondary | ICD-10-CM | POA: Diagnosis not present

## 2023-11-09 DIAGNOSIS — C50911 Malignant neoplasm of unspecified site of right female breast: Secondary | ICD-10-CM | POA: Insufficient documentation

## 2023-11-09 DIAGNOSIS — R92323 Mammographic fibroglandular density, bilateral breasts: Secondary | ICD-10-CM | POA: Diagnosis not present

## 2023-11-09 MED ORDER — GADOBUTROL 1 MMOL/ML IV SOLN
8.0000 mL | Freq: Once | INTRAVENOUS | Status: AC | PRN
  Administered 2023-11-09: 8 mL via INTRAVENOUS

## 2023-11-15 ENCOUNTER — Other Ambulatory Visit: Payer: Self-pay

## 2023-11-15 ENCOUNTER — Encounter: Payer: Self-pay | Admitting: Oncology

## 2023-11-15 ENCOUNTER — Inpatient Hospital Stay: Admitting: Occupational Therapy

## 2023-11-15 ENCOUNTER — Ambulatory Visit
Admission: RE | Admit: 2023-11-15 | Discharge: 2023-11-15 | Disposition: A | Discharge status: Home / Self Care | Source: Ambulatory Visit | Attending: Radiation Oncology | Admitting: Radiation Oncology

## 2023-11-15 ENCOUNTER — Encounter: Payer: Self-pay | Admitting: Radiation Oncology

## 2023-11-15 ENCOUNTER — Inpatient Hospital Stay: Admitting: Oncology

## 2023-11-15 VITALS — BP 145/65 | HR 76 | Temp 97.8°F | Resp 15 | Ht 60.0 in | Wt 181.1 lb

## 2023-11-15 VITALS — BP 134/70 | HR 77 | Temp 97.3°F | Resp 18 | Ht 60.0 in | Wt 179.0 lb

## 2023-11-15 DIAGNOSIS — E079 Disorder of thyroid, unspecified: Secondary | ICD-10-CM | POA: Insufficient documentation

## 2023-11-15 DIAGNOSIS — I10 Essential (primary) hypertension: Secondary | ICD-10-CM | POA: Insufficient documentation

## 2023-11-15 DIAGNOSIS — E785 Hyperlipidemia, unspecified: Secondary | ICD-10-CM | POA: Insufficient documentation

## 2023-11-15 DIAGNOSIS — C50412 Malignant neoplasm of upper-outer quadrant of left female breast: Secondary | ICD-10-CM | POA: Insufficient documentation

## 2023-11-15 DIAGNOSIS — Z87891 Personal history of nicotine dependence: Secondary | ICD-10-CM | POA: Insufficient documentation

## 2023-11-15 DIAGNOSIS — C50912 Malignant neoplasm of unspecified site of left female breast: Secondary | ICD-10-CM

## 2023-11-15 DIAGNOSIS — Z79899 Other long term (current) drug therapy: Secondary | ICD-10-CM | POA: Diagnosis not present

## 2023-11-15 DIAGNOSIS — Z79890 Hormone replacement therapy (postmenopausal): Secondary | ICD-10-CM | POA: Diagnosis not present

## 2023-11-15 DIAGNOSIS — Z170 Estrogen receptor positive status [ER+]: Secondary | ICD-10-CM

## 2023-11-15 DIAGNOSIS — M25612 Stiffness of left shoulder, not elsewhere classified: Secondary | ICD-10-CM

## 2023-11-15 DIAGNOSIS — Z803 Family history of malignant neoplasm of breast: Secondary | ICD-10-CM | POA: Insufficient documentation

## 2023-11-15 DIAGNOSIS — Z17411 Hormone receptor positive with human epidermal growth factor receptor 2 negative status: Secondary | ICD-10-CM | POA: Diagnosis not present

## 2023-11-15 MED ORDER — LETROZOLE 2.5 MG PO TABS
2.5000 mg | ORAL_TABLET | Freq: Every day | ORAL | 2 refills | Status: AC
  Filled 2023-11-15: qty 90, 90d supply, fill #0
  Filled 2024-02-22: qty 90, 90d supply, fill #1

## 2023-11-15 NOTE — Therapy (Signed)
 OUTPATIENT OCCUPATIONAL THERAPY BREAST CANCER POSTOP TREATMENT   Patient Name: Gail Hunter MRN: [id number] DOB:1960-06-16, 63 y.o., female Today's Date: 11/15/2023  END OF SESSION:  OT End of Session - 11/15/23 1622     Visit Number 2    Number of Visits 3    Date for Recertification  12/13/23    OT Start Time 1520    OT Stop Time 1547    OT Time Calculation (min) 27 min    Activity Tolerance Patient tolerated treatment well    Behavior During Therapy WFL for tasks assessed/performed          Past Medical History:  Diagnosis Date   Allergy    Anemia    History of chicken pox    History of measles    Hyperlipidemia    Hypertension    Thyroid disease    Past Surgical History:  Procedure Laterality Date   AXILLARY SENTINEL NODE BIOPSY Left 10/25/2023   Procedure: BIOPSY, LYMPH NODE, SENTINEL, AXILLARY;  Surgeon: Rodolph Romano, MD;  Location: [facility];  Service: General;  Laterality: Left;   BREAST BIOPSY Left 10/09/2023   US  LT BREAST BX W LOC DEV 1ST LESION IMG BX SPEC US  GUIDE 10/09/2023 [facility]   BREAST LUMPECTOMY WITH RADIO FREQUENCY LOCALIZER Left 10/25/2023   Procedure: BREAST LUMPECTOMY WITH RADIO FREQUENCY LOCALIZER;  Surgeon: Rodolph Romano, MD;  Location: [facility];  Service: General;  Laterality: Left;   CESAREAN SECTION     CHOLECYSTECTOMY  02/2002   LAPAROSCOPIC ABDOMINAL EXPLORATION     Patient Active Problem List   Diagnosis Date Noted   Genetic testing 10/31/2023   Malignant neoplasm of upper-outer quadrant of left breast in female, estrogen receptor positive (HCC) 10/22/2023   Primary hypertension 12/07/2022   Class 1 obesity due to excess calories without serious comorbidity with body mass index (BMI) of 33.0 to 33.9 in adult 06/07/2022   Hypercholesterolemia with hypertriglyceridemia 09/05/2014   Hypothyroidism 09/05/2014    PCP: Dr Vicci  REFERRING PROVIDER: Dr Melanee MART DIAG:  L breast cancer  THERAPY DIAG:   Stiffness of left shoulder, not elsewhere classified  Rationale for Evaluation and Treatment: Rehabilitation  ONSET DATE: 10/04/23  SUBJECTIVE:                                                                                                                                                                                           SUBJECTIVE STATEMENT: I am doing really good.  I had my lumpectomy 10/25/2023.  Had some swelling and some soreness but that improved.  Since last week.  I am going to have radiation and  then that 5-year medication. PERTINENT HISTORY:  Patient had L lumpectomy by Dr Rodolph on 10/25/2023 last checkup was 11/07/2023.  She has met with Dr. Jacobo and Dr. Camelia today.  PATIENT GOALS:   reduce lymphedema risk and learn post op HEP.   PAIN:  Are you having pain? No pain  PRECAUTIONS: Active CA , L lumpectomy    HAND DOMINANCE: right  WEIGHT BEARING RESTRICTIONS: No  FALLS:  Has patient fallen in last 6 months? 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[employer] [place]  LEISURE: Likes to do crafts, scrap booking and reading   OBJECTIVE:  COGNITION: Overall cognitive status: Within functional limits for tasks assessed    POSTURE:  Forward head and rounded shoulders posture  UPPER EXTREMITY AROM/PROM: Bilateral shoulder active range of motion within normal limits in all planes preassessment  CERVICAL AROM: All within normal limits:    UPPER EXTREMITY STRENGTH: 5/5 strength in bilateral shoulder flexion, abduction external and internal rotation  LYMPHEDEMA ASSESSMENTS:   LANDMARK RIGHT   eval  10 cm proximal to olecranon process   Olecranon process   10 cm proximal to ulnar styloid process   Just proximal to ulnar styloid process   Across hand at thumb web space   At base of 2nd digit   (Blank rows = not tested)  LANDMARK LEFT   eval  10 cm proximal to  olecranon process   Olecranon process   10 cm proximal to ulnar styloid process   Just proximal to ulnar styloid process   Across hand at thumb web space   At base of 2nd digit   (Blank rows = not tested)  L-DEX LYMPHEDEMA SCREENING:  The patient was assessed using the L-Dex machine today to produce a lymphedema index baseline score. The patient will be reassessed on a regular basis (typically every 3 months) to obtain new L-Dex scores. If the score is > 6.5 points away from his/her baseline score indicating onset of subclinical lymphedema, it will be recommended to wear a compression garment for 4 weeks, 12 hours per day and then be reassessed. If the score continues to be > 6.5 points from baseline at reassessment, we will initiate lymphedema treatment. Assessing in this manner has a 95% rate of preventing clinically significant lymphedema.   L-DEX FLOWSHEETS - 11/15/23 1600       L-DEX LYMPHEDEMA SCREENING   Measurement Type Unilateral    L-DEX MEASUREMENT EXTREMITY Upper Extremity    POSITION  Standing    DOMINANT SIDE Right    At Risk Side Left    BASELINE SCORE (UNILATERAL) 0.9    L-DEX SCORE (UNILATERAL) 1.6    VALUE CHANGE (UNILAT) 0.7         Session today: Patient report returned back to work this past Monday. Bilateral shoulder active range of motion within normal limits.  Patient able to get into radiation position. Strength within functional limits. Patient to have some scar tissue on bilateral scars.  Patient was educated on scar mobilization and massage prior to radiation starting.  Verbalized and demonstrated understanding. Patient is a [employer].  In occupational health. Discussed with patient  [employer] l exercise program-patient interested.  Will add patient for the list-for the spring class. Patient was educated in lymphedema signs and symptoms as well as precautions and prevention.  Patient has a handout that was in the cancer journal that was provided and  reviewed  with her on the evaluation today. L-Dex score within normal range. Patient starting radiation.  Can follow-up with breast navigator after radiation.  PATIENT EDUCATION:  Education details: Lymphedema risk reduction and post op shoulder/posture HEP Person educated: Patient Education method: Explanation, Demonstration, Handout Education comprehension: Patient verbalized understanding and returned demonstration  ASSESSMENT:  CLINICAL IMPRESSION: Patient had left lumpectomy on 10/25/2023 by Dr. Hillery was educated in a home program to start postop for bilateral shoulder range of motion prior to surgery.  Patient present today with great progress in left upper extremity active range of motion and strength-patient denies pain.  Did educate patient on initiating some soft scar massage prior to starting radiation.  Done lymphedema education on prevention and precautions as well as signs and symptoms.  Patient's L-Dex score was within normal range today.  Patient can follow-up with breast navigator after radiation to repeat L-Dex screens and there after every 3 months for 2 years to detect subclinical lymphedema.  Pt will benefit from skilled therapeutic intervention to improve on the following deficits: Decreased knowledge of precautions and lymphedema education, impaired UE functional use, pain, decreased ROM, postural dysfunction.   OT treatment/interventions: ADL/self-care home management, pt/family education, therapeutic exercise,manual therapy  REHAB POTENTIAL: Good  CLINICAL DECISION MAKING: Stable/uncomplicated  EVALUATION COMPLEXITY: Low   GOALS: Goals reviewed with patient? YES  LONG TERM GOALS: (STG=LTG)    Name Target Date Goal status  1 Pt will be able to verbalize understanding of pertinent lymphedema risk reduction practices relevant to her dx specifically related to skin care.  Baseline:  No knowledge Today Achieved  2 Pt will be able to return demo  and/or verbalize understanding of the post op HEP related to regaining shoulder ROM. Baseline:  No knowledge Today Achieved at eval       4 Pt will demo she has regained full shoulder ROM and function post operatively compared to baselines.  Baseline: See objective measurements taken today. Today Achieved    PLAN:  OT FREQUENCY/DURATION: EVAL and 1-2 follow up appointment as needed  PLAN FOR NEXT SESSION: will reassess 2-3 weeks post op to determine needs.   Patient will follow up at outpatient cancer rehab 2-3 weeks following surgery.  If the patient requires occupational therapy at that time, a specific plan will be dictated and sent to the referring physician for approval. Occupational Therapy Information for After Breast Cancer Surgery/Treatment:  Lymphedema is a swelling condition that you may be at risk for in your arm if you have lymph nodes removed from the armpit area.  After a sentinel node biopsy, the risk is approximately 5-9% and is higher after an axillary node dissection.  There is treatment available for this condition and it is not life-threatening.  Contact your physician or occupational therapist with concerns. You may begin the 4 shoulder/posture exercises (see additional sheet) when permitted by your physician (typically a week after surgery).  If you have drains, you may need to wait until those are removed before beginning range of motion exercises.  A general recommendation is to not lift your arms above shoulder height until drains are removed.  These exercises should be done to your tolerance and gently.  This is not a no pain/no gain type of recovery so listen to your body and stretch into the range of motion that you can tolerate, stopping if you have pain.  If you are having immediate reconstruction, ask your plastic surgeon about doing exercises as he or she may want you to  wait. .  While undergoing any medical procedure or treatment, try to avoid blood pressure  being taken or needle sticks from occurring on the arm on the side of cancer.   This recommendation begins after surgery and continues for the rest of your life.  This may help reduce your risk of getting lymphedema (swelling in your arm). An excellent resource for those seeking information on lymphedema is the National Lymphedema Network's web site. It can be accessed at www.lymphnet.org If you notice swelling in your hand, arm or breast at any time following surgery (even if it is many years from now), please contact your doctor or occupational therapist to discuss this.  Lymphedema can be treated at any time but it is easier for you if it is treated early on.  If you feel like your shoulder motion is not returning to normal in a reasonable amount of time, please contact your surgeon or occupational therapist.  [facility] [phone]. [address] [place], [place] [address]       Ancel Peters, OTR/L,CLT 11/15/2023, 4:26 PM

## 2023-11-15 NOTE — Progress Notes (Unsigned)
 Patient is doing ok, she just got done meeting with Dr. Camelia.

## 2023-11-15 NOTE — Consult Note (Signed)
 NEW PATIENT EVALUATION  Name: Gail Hunter  MRN: [id number]  Date:   11/15/2023     DOB: 1960-08-21   This 63 y.o. female patient presents to the clinic for initial evaluation of stage pT1a (pT1c pN0 (SN) M0) invasive lobular carcinoma of the left breast status post wide local excision and sentinel node biopsy ER/PR positive.  REFERRING PHYSICIAN: Melanee Annah BROCKS, MD  CHIEF COMPLAINT:  Chief Complaint  Patient presents with   Breast Cancer    DIAGNOSIS: The encounter diagnosis was Malignant neoplasm of upper-outer quadrant of left breast in female, estrogen receptor positive (HCC).   PREVIOUS INVESTIGATIONS:  Mammograms ultrasound reviewed breast MRI reviewed Clinical notes reviewed Pathology reports reviewed  HPI: Patient is a 63 year old female who presents with an abnormal mammogram of her left breast.  There originally was architectural distortion on screening mammogram confirmed on diagnostic mammogram to be a 1 cm suspicious mass at the 2 o'clock position of the left breast 5 cm from nipple.  No abnormal left left axillary lymph nodes were noted.  She underwent ultrasound-guided biopsy which was positive for ER/PR positive HER2/neu overexpressed lobular carcinoma.  MRI of her bilateral breasts showed fluid collection after wide local excision consistent with seroma.  No evidence of right breast malignancy.  At the time of wide local excision tumor measured 1.5 cm margins were clear tumor was at lobular carcinoma overall grade 2.  There was lobular carcinoma in situ also present.  2 sentinel lymph nodes were negative for metastatic disease.  Her Oncotype DX was 10 and she will not receive systemic treatment.  She is seen today for evaluation she is doing well.  She specifically denies breast tenderness cough or bone pain.  PLANNED TREATMENT REGIMEN: Hypofractionated DIBH left breast radiation with boost  PAST MEDICAL HISTORY:  has a past medical history of Allergy, Anemia, History  of chicken pox, History of measles, Hyperlipidemia, Hypertension, and Thyroid disease.    PAST SURGICAL HISTORY:  Past Surgical History:  Procedure Laterality Date   AXILLARY SENTINEL NODE BIOPSY Left 10/25/2023   Procedure: BIOPSY, LYMPH NODE, SENTINEL, AXILLARY;  Surgeon: Rodolph Romano, MD;  Location: [facility];  Service: General;  Laterality: Left;   BREAST BIOPSY Left 10/09/2023   US  LT BREAST BX W LOC DEV 1ST LESION IMG BX SPEC US  GUIDE 10/09/2023 [facility]   BREAST LUMPECTOMY WITH RADIO FREQUENCY LOCALIZER Left 10/25/2023   Procedure: BREAST LUMPECTOMY WITH RADIO FREQUENCY LOCALIZER;  Surgeon: Rodolph Romano, MD;  Location: [facility];  Service: General;  Laterality: Left;   CESAREAN SECTION     CHOLECYSTECTOMY  02/2002   LAPAROSCOPIC ABDOMINAL EXPLORATION      FAMILY HISTORY: family history includes Alzheimer's disease in her father and mother; Breast cancer in an other family member; Diabetes in her sister; Hyperlipidemia in her brother; Thyroid disease in her mother.  SOCIAL HISTORY:  reports that she quit smoking about 23 years ago. Her smoking use included cigarettes. She has never used smokeless tobacco. She reports that she does not drink alcohol and does not use drugs.  ALLERGIES: Lisinopril   MEDICATIONS:  Current Outpatient Medications  Medication Sig Dispense Refill   ADK [id number] UNIT-MCG CAPS Take 1 tablet by mouth daily.     Barberry-Oreg Grape-Goldenseal (BERBERINE COMPLEX PO) Take 2 tablets by mouth daily.     Coenzyme Q10 200 MG capsule Take 200 mg by mouth daily.     ferrous sulfate 325 (65 FE) MG tablet Take 325 mg by mouth daily.  fluticasone (FLONASE) 50 MCG/ACT nasal spray Place 1 spray into both nostrils daily.     gemfibrozil  (LOPID ) 600 MG tablet Take 1 tablet (600 mg total) by mouth 2 (two) times daily before a meal. 180 tablet 1   levothyroxine  (SYNTHROID ) 137 MCG tablet Take 1 tablet (137 mcg total) by mouth daily before  breakfast. 90 tablet 3   magnesium oxide (MAG-OX) 400 (240 Mg) MG tablet Take 400 mg by mouth daily.     Omega-3 Fatty Acids (FISH OIL) 300 MG CAPS Take 300 mg by mouth daily.     rosuvastatin  (CRESTOR ) 40 MG tablet Take 1 tablet (40 mg total) by mouth daily. 90 tablet 1   semaglutide -weight management (WEGOVY ) 1 MG/0.5ML SOAJ SQ injection Inject 1 mg into the skin once a week. 2 mL 1   spironolactone  (ALDACTONE ) 50 MG tablet Take 1 tablet (50 mg total) by mouth daily. 90 tablet 4   No current facility-administered medications for this encounter.    ECOG PERFORMANCE STATUS:  0 - Asymptomatic  REVIEW OF SYSTEMS: Patient denies any weight loss, fatigue, weakness, fever, chills or night sweats. Patient denies any loss of vision, blurred vision. Patient denies any ringing  of the ears or hearing loss. No irregular heartbeat. Patient denies heart murmur or history of fainting. Patient denies any chest pain or pain radiating to her upper extremities. Patient denies any shortness of breath, difficulty breathing at night, cough or hemoptysis. Patient denies any swelling in the lower legs. Patient denies any nausea vomiting, vomiting of blood, or coffee ground material in the vomitus. Patient denies any stomach pain. Patient states has had normal bowel movements no significant constipation or diarrhea. Patient denies any dysuria, hematuria or significant nocturia. Patient denies any problems walking, swelling in the joints or loss of balance. Patient denies any skin changes, loss of hair or loss of weight. Patient denies any excessive worrying or anxiety or significant depression. Patient denies any problems with insomnia. Patient denies excessive thirst, polyuria, polydipsia. Patient denies any swollen glands, patient denies easy bruising or easy bleeding. Patient denies any recent infections, allergies or URI. Patient s visual fields have not changed significantly in recent time.   PHYSICAL EXAM: LMP  03/17/2016  Patient status post wide local excision of the left breast incision is healing well.  No dominant masses noted in either breast.  No axillary or supraclavicular adenopathy is identified.  Well-developed well-nourished patient in NAD. HEENT reveals PERLA, EOMI, discs not visualized.  Oral cavity is clear. No oral mucosal lesions are identified. Neck is clear without evidence of cervical or supraclavicular adenopathy. Lungs are clear to A&P. Cardiac examination is essentially unremarkable with regular rate and rhythm without murmur rub or thrill. Abdomen is benign with no organomegaly or masses noted. Motor sensory and DTR levels are equal and symmetric in the upper and lower extremities. Cranial nerves II through XII are grossly intact. Proprioception is intact. No peripheral adenopathy or edema is identified. No motor or sensory levels are noted. Crude visual fields are within normal range.  LABORATORY DATA: Pathology reports reviewed    RADIOLOGY RESULTS: Mammograms ultrasound and MRI scans all reviewed compatible with above-stated findings.   IMPRESSION: Stage Ia lobular carcinoma ER positive the left breast status post wide local excision and sentinel node biopsy in 63 year old female  PLAN: Present time patient is doing well I have recommended a 3-week course of whole breast radiation using deep inspiration breath-hold technique.  Would also boost her scar another 1000 cGy  in photons.  Risks and benefits of treatment occluding skin reaction fatigue alteration of blood counts possible occlusion of superficial lung all were discussed in detail with the patient.  I personally set up and ordered CT simulation for next week.  Patient also be a candidate for endocrine therapy after completion of radiation.  I would like to take this opportunity to thank you for allowing me to participate in the care of your patient.SABRA Marcey Penton, MD

## 2023-11-16 ENCOUNTER — Other Ambulatory Visit: Payer: Self-pay

## 2023-11-16 ENCOUNTER — Other Ambulatory Visit: Payer: Self-pay | Admitting: Family Medicine

## 2023-11-16 DIAGNOSIS — C50412 Malignant neoplasm of upper-outer quadrant of left female breast: Secondary | ICD-10-CM | POA: Diagnosis not present

## 2023-11-16 DIAGNOSIS — Z170 Estrogen receptor positive status [ER+]: Secondary | ICD-10-CM | POA: Diagnosis not present

## 2023-11-16 DIAGNOSIS — L680 Hirsutism: Secondary | ICD-10-CM

## 2023-11-16 NOTE — Progress Notes (Signed)
 Hematology/Oncology Consult note [facility]  [phone] [phone]  Patient Care Team: Vicci Duwaine SQUIBB, DO as PCP - General (Family Medicine) Georgina Shasta POUR, RN as Oncology Nurse Navigator Lenn Aran, MD as Consulting Physician (Radiation Oncology) Melanee Annah BROCKS, MD as Consulting Physician (Oncology)   Name of the patient: Gail Hunter  [id number]  06/05/1960   Date of visit: 11/16/23  Diagnosis-  Cancer Staging  Malignant neoplasm of upper-outer quadrant of left breast in female, estrogen receptor positive (HCC) Staging form: Breast, AJCC 8th Edition - Clinical stage from 10/22/2023: Stage IA (cT1b, cN0, cM0, G2, ER+, PR+, HER2-) - Signed by Melanee Annah BROCKS, MD on 10/22/2023 Histopathologic type: Adenocarcinoma, NOS Stage prefix: Initial diagnosis Histologic grading system: 3 grade system    Chief complaint/ Reason for visit- discuss oncotype results and further management  Heme/Onc history:  Patient is a 63 year old female with a past medical history significant for hypercholesteremia hypertension and hypothyroidism who underwent a routine screening mammogram on 09/27/2023 which showed possible distortion in the left breast.  This was followed by a diagnostic mammogram and an ultrasound which showed 1 cm suspicious mass at the 2 o'clock position of the left breast 5 cm from the nipple.  Left axillary lymph nodes appeared normal.  Patient had a core biopsy of this mass which was consistent with invasive mammary carcinoma grade two 1 cm ER 99% positive, PR 100% positive Ki-67 5% and HER2 negative +1.  Patient has met with Dr. Cesar and will be undergoing lumpectomy and sentinel lymph node biopsy.   Genetic testing negative.  Final pathology from lumpectomy on10/08/2023 showed 1.5 cm grade 2 invasive mammary carcinoma with negative margins.  2 sentinel lymph nodes negative for malignancy.  Oncotype score came back at low risk of 10 and  therefore she would not benefit from adjuvant chemotherapy.  Interval history- Discussed the use of AI scribe software for clinical note transcription with the patient, who gave verbal consent to proceed  Gail Hunter is a 63 year old female with stage one left breast cancer who presents for post-surgical follow-up.   She is postmenopausal and has not had menstrual cycles for a long time. She is currently on medication for cholesterol management.  She is taking a vitamin D  combination supplement and will verify the dosage. She has never had a bone density scan. No gastrointestinal distress with medications. She is concerned about the impact on her heart health and cholesterol levels due to estrogen deprivation.       ECOG PS- 0 Pain scale- 0   Review of systems- Review of Systems  Constitutional:  Negative for chills, fever, malaise/fatigue and weight loss.  HENT:  Negative for congestion, ear discharge and nosebleeds.   Eyes:  Negative for blurred vision.  Respiratory:  Negative for cough, hemoptysis, sputum production, shortness of breath and wheezing.   Cardiovascular:  Negative for chest pain, palpitations, orthopnea and claudication.  Gastrointestinal:  Negative for abdominal pain, blood in stool, constipation, diarrhea, heartburn, melena, nausea and vomiting.  Genitourinary:  Negative for dysuria, flank pain, frequency, hematuria and urgency.  Musculoskeletal:  Negative for back pain, joint pain and myalgias.  Skin:  Negative for rash.  Neurological:  Negative for dizziness, tingling, focal weakness, seizures, weakness and headaches.  Endo/Heme/Allergies:  Does not bruise/bleed easily.  Psychiatric/Behavioral:  Negative for depression and suicidal ideas. The patient does not have insomnia.       Allergies  Allergen Reactions  Lisinopril  Cough     Past Medical History:  Diagnosis Date   Allergy    Anemia    History of chicken pox    History of measles     Hyperlipidemia    Hypertension    Thyroid disease      Past Surgical History:  Procedure Laterality Date   AXILLARY SENTINEL NODE BIOPSY Left 10/25/2023   Procedure: BIOPSY, LYMPH NODE, SENTINEL, AXILLARY;  Surgeon: Rodolph Romano, MD;  Location: [facility] ORS;  Service: General;  Laterality: Left;   BREAST BIOPSY Left 10/09/2023   US  LT BREAST BX W LOC DEV 1ST LESION IMG BX SPEC US  GUIDE 10/09/2023 [facility]   BREAST LUMPECTOMY WITH RADIO FREQUENCY LOCALIZER Left 10/25/2023   Procedure: BREAST LUMPECTOMY WITH RADIO FREQUENCY LOCALIZER;  Surgeon: Rodolph Romano, MD;  Location: [facility] ORS;  Service: General;  Laterality: Left;   CESAREAN SECTION     CHOLECYSTECTOMY  02/2002   LAPAROSCOPIC ABDOMINAL EXPLORATION      Social History   Socioeconomic History   Marital status: Married    Spouse name: Garnette   Number of children: 1   Years of education: Not on file   Highest education level: Not on file  Occupational History   Not on file  Tobacco Use   Smoking status: Former    Current packs/day: 0.00    Types: Cigarettes    Quit date: 01/17/2000    Years since quitting: 23.8   Smokeless tobacco: Never  Vaping Use   Vaping status: Never Used  Substance and Sexual Activity   Alcohol use: No   Drug use: No   Sexual activity: Not Currently  Other Topics Concern   Not on file  Social History Narrative   Not on file   Social Drivers of Health   Financial Resource Strain: Low Risk  (10/16/2023)   Received from [facility]   Overall Financial Resource Strain (CARDIA)    Difficulty of Paying Living Expenses: Not hard at all  Food Insecurity: No Food Insecurity (10/17/2023)   Hunger Vital Sign    Worried About Running Out of Food in the Last Year: Never true    Ran Out of Food in the Last Year: Never true  Transportation Needs: No Transportation Needs (10/17/2023)   PRAPARE - Administrator, Civil Service (Medical): No    Lack of  Transportation (Non-Medical): No  Physical Activity: Not on file  Stress: Not on file  Social Connections: Not on file  Intimate Partner Violence: Not At Risk (10/17/2023)   Humiliation, Afraid, Rape, and Kick questionnaire    Fear of Current or Ex-Partner: No    Emotionally Abused: No    Physically Abused: No    Sexually Abused: No    Family History  Problem Relation Age of Onset   Alzheimer's disease Mother    Thyroid disease Mother    Alzheimer's disease Father    Diabetes Sister    Hyperlipidemia Brother    Breast cancer Other        mat great uncle     Current Outpatient Medications:    ADK 5000-5000-500 UNIT-MCG CAPS, Take 1 tablet by mouth daily., Disp: , Rfl:    Berberine Chloride (BERBERINE HCI) 500 MG CAPS, Take by mouth 2 (two) times daily., Disp: , Rfl:    Coenzyme Q10 200 MG capsule, Take 200 mg by mouth daily., Disp: , Rfl:    ferrous sulfate 325 (65 FE) MG tablet, Take 325 mg by mouth  daily., Disp: , Rfl:    fluticasone (FLONASE) 50 MCG/ACT nasal spray, Place 1 spray into both nostrils daily., Disp: , Rfl:    gemfibrozil  (LOPID ) 600 MG tablet, Take 1 tablet (600 mg total) by mouth 2 (two) times daily before a meal., Disp: 180 tablet, Rfl: 1   letrozole (FEMARA) 2.5 MG tablet, Take 1 tablet (2.5 mg total) by mouth daily., Disp: 90 tablet, Rfl: 2   levothyroxine  (SYNTHROID ) 137 MCG tablet, Take 1 tablet (137 mcg total) by mouth daily before breakfast., Disp: 90 tablet, Rfl: 3   magnesium oxide (MAG-OX) 400 (240 Mg) MG tablet, Take 400 mg by mouth daily. Jst taking the magnesium, Disp: , Rfl:    Omega-3 Fatty Acids (FISH OIL) 300 MG CAPS, Take 300 mg by mouth daily., Disp: , Rfl:    rosuvastatin  (CRESTOR ) 40 MG tablet, Take 1 tablet (40 mg total) by mouth daily., Disp: 90 tablet, Rfl: 1   semaglutide -weight management (WEGOVY ) 1 MG/0.5ML SOAJ SQ injection, Inject 1 mg into the skin once a week., Disp: 2 mL, Rfl: 1   spironolactone  (ALDACTONE ) 50 MG tablet, Take 1  tablet (50 mg total) by mouth daily., Disp: 90 tablet, Rfl: 4  Physical exam:  Vitals:   11/15/23 1438  BP: 134/70  Pulse: 77  Resp: 18  Temp: (!) 97.3 F (36.3 C)  TempSrc: Tympanic  SpO2: 97%  Weight: 179 lb (81.2 kg)  Height: 5' (1.524 m)   Physical Exam Cardiovascular:     Rate and Rhythm: Normal rate and regular rhythm.     Heart sounds: Normal heart sounds.  Pulmonary:     Effort: Pulmonary effort is normal.     Breath sounds: Normal breath sounds.  Skin:    General: Skin is warm and dry.  Neurological:     Mental Status: She is alert and oriented to person, place, and time.      I have personally reviewed labs listed below:    Latest Ref Rng & Units 10/23/2023    8:38 AM  CMP  Glucose 70 - 99 mg/dL 894   BUN 8 - 27 mg/dL 15   Creatinine 9.42 - 1.00 mg/dL 9.07   Sodium 865 - 855 mmol/L 141   Potassium 3.5 - 5.2 mmol/L 4.2   Chloride 96 - 106 mmol/L 106   CO2 20 - 29 mmol/L 20   Calcium  8.7 - 10.3 mg/dL 9.6       Latest Ref Rng & Units 10/23/2023    8:38 AM  CBC  WBC 3.4 - 10.8 x10E3/uL 6.3   Hemoglobin 11.1 - 15.9 g/dL 86.9   Hematocrit 65.9 - 46.6 % 40.3   Platelets 150 - 450 x10E3/uL 326    I have personally reviewed Radiology images listed below: No images are attached to the encounter.  MR BREAST BILATERAL W WO CONTRAST INC CAD Result Date: 11/10/2023 CLINICAL DATA:  63 year old woman with history of invasive lobular carcinoma stage I underwent lumpectomy and sentinel lymph node biopsy on 10/25/2023. Ultrasound-guided core needle biopsy performed at 10/09/2023 showed invasive ductal carcinoma, however invasive lobular carcinoma was identified at the time of lumpectomy. Margins were negative. No malignancy identified within the LEFT axillary lymph nodes. EXAM: BILATERAL BREAST MRI WITH AND WITHOUT CONTRAST TECHNIQUE: Multiplanar, multisequence MR images of both breasts were obtained prior to and following the intravenous administration of 8 ml of  Gadavist Three-dimensional MR images were rendered by post-processing of the original MR data on an independent workstation. The three-dimensional MR  images were interpreted, and findings are reported in the following complete MRI report for this study. Three dimensional images were evaluated at the independent interpreting workstation using the DynaCAD thin client. COMPARISON:  Previous exam(s). FINDINGS: Breast composition: c. Heterogeneous fibroglandular tissue. Background parenchymal enhancement: Moderate. Right breast: No mass or abnormal enhancement. Left breast: 7.5 x 4.0 x 4.4 cm fluid collection within the LEFT breast is consistent with a postop seroma. Mild enhancement seen in the rim of the seroma is most likely due to postsurgical change. No mass or suspicious enhancement identified. Lymph nodes: No abnormally enlarged lymph nodes are seen. Edema seen within the LEFT axilla is consistent with recent lymph node dissection. Ancillary findings:  None. IMPRESSION: 1. No evidence of RIGHT breast malignancy. 2. 7.5 x 4.0 x 4.4 cm LEFT breast fluid collection is consistent with seroma. No suspicious mass or abnormal enhancement identified within the LEFT breast, however evaluation is limited due moderate background parenchymal enhancement and postop changes from recent lumpectomy. RECOMMENDATION: BILATERAL diagnostic mammogram in September 2026. BI-RADS CATEGORY  2: Benign. Electronically Signed   By: Aliene Lloyd M.D.   On: 11/10/2023 15:09   MM BREAST SURGICAL SPECIMEN Result Date: 10/25/2023 CLINICAL DATA:  Status post Savi Scout localized LEFT breast lumpectomy. Patient underwent ultrasound-guided biopsy of a LEFT breast mass which demonstrated invasive ductal carcinoma. SAVI scout was placed at time of biopsy. EXAM: SPECIMEN RADIOGRAPH OF THE LEFT BREAST COMPARISON:  Previous exam(s). FINDINGS: Status post excision of the LEFT breast. The [facility] reflector is present within the specimen. IMPRESSION:  Specimen radiograph of the LEFT breast. Electronically Signed   By: Corean Salter M.D.   On: 10/25/2023 12:00   NM Sentinel Node Inj-No Rpt (Breast) Result Date: 10/25/2023 Lymphoseek was injected by the Nuclear Medicine Technologist for sentinel lymph node localization.     Assessment and plan- Patient is a 63 y.o. female history of pathological prognostic stage I invasive mammary carcinoma of the left breast pT1c N0 M0 ER/PR positive HER2 negative status postlumpectomy and sentinel lymph node biopsy here to discuss Oncotype results and further management  Assessment and Plan    Stage 1A left breast cancer, ER/PR positive, HER2 negative, status post lumpectomy Tumor 1.5 cm, grade 2, negative margins, no lymph node involvement (T1cN0M0). Oncotype score 10, low recurrence risk. Cancer driven by estrogen and progesterone. - Initiate radiation therapy for four weeks. - Given that her tumor was ER PR positive hormone therapy is indicated at this time.  Idiscussed the role for hormone therapy. Given that she is postmenopausal I would favor 5 years of adjuvant hormone therapy with aromatase inhibitor. I discussed the risks and benefits of Letrozole including all but not limited to fatigue, hypercholesterolemia, hot flashes, arthralgias and worsening bone health.  Patient will also need to be on calcium  1200 mg along with vitamin D  800 international units.  We will obtain a baseline bone density scan written information about Letrozole given to the patient. I would like her to finish radiation therapy and start hormone therapy thereafter. Patient verbalized understanding and agrees to proceed   - Provided information on letrozole side effects: menopausal symptoms, increased blood pressure, vaginal dryness, bone density impact. - Instruct to take calcium  1200 mg and vitamin D  800 IU daily. - Monitor cholesterol levels with primary care physician.  Risk of osteopenia/osteoporosis due to planned  aromatase inhibitor therapy Aromatase inhibitors increase bone density loss risk by blocking estrogen. Baseline bone density scan needed to assess bone health. Consider tamoxifen  if significant osteoporosis detected. - Order baseline bone density scan. - Evaluate bone density results for aromatase inhibitor therapy appropriateness. - Consider tamoxifen if significant osteoporosis detected.         Visit Diagnosis 1. Invasive ductal carcinoma of breast, left (HCC)   2. Malignant neoplasm of upper-outer quadrant of left breast in female, estrogen receptor positive (HCC)      Dr. Annah Skene, MD, MPH [facility] at [facility] [phone] 11/16/2023 5:59 PM

## 2023-11-17 ENCOUNTER — Other Ambulatory Visit: Payer: Self-pay

## 2023-11-17 MED FILL — Spironolactone Tab 50 MG: ORAL | 90 days supply | Qty: 90 | Fill #0 | Status: AC

## 2023-11-17 NOTE — Telephone Encounter (Signed)
 Requested Prescriptions  Pending Prescriptions Disp Refills   spironolactone  (ALDACTONE ) 50 MG tablet 90 tablet 1    Sig: Take 1 tablet (50 mg total) by mouth daily.     Cardiovascular: Diuretics - Aldosterone Antagonist Passed - 11/17/2023  5:49 PM      Passed - Cr in normal range and within 180 days    Creatinine, Ser  Date Value Ref Range Status  10/23/2023 0.92 0.57 - 1.00 mg/dL Final         Passed - K in normal range and within 180 days    Potassium  Date Value Ref Range Status  10/23/2023 4.2 3.5 - 5.2 mmol/L Final         Passed - Na in normal range and within 180 days    Sodium  Date Value Ref Range Status  10/23/2023 141 134 - 144 mmol/L Final         Passed - eGFR is 30 or above and within 180 days    GFR calc Af Amer  Date Value Ref Range Status  11/22/2019 68 >59 mL/min/1.73 Final    Comment:    **In accordance with recommendations from the NKF-ASN Task force,**   [facility] is in the process of updating its eGFR calculation to the   2021 CKD-EPI creatinine equation that estimates kidney function   without a race variable.    GFR calc non Af Amer  Date Value Ref Range Status  11/22/2019 59 (L) >59 mL/min/1.73 Final   eGFR  Date Value Ref Range Status  10/23/2023 71 >59 mL/min/1.73 Final         Passed - Last BP in normal range    BP Readings from Last 1 Encounters:  11/15/23 (!) 145/65         Passed - Valid encounter within last 6 months    Recent Outpatient Visits           1 month ago Class 1 obesity due to excess calories without serious comorbidity with body mass index (BMI) of 33.0 to 33.9 in adult   [facility] [place], Megan P, DO   4 months ago Acquired hypothyroidism   [facility] [place], [place], DO

## 2023-11-22 ENCOUNTER — Ambulatory Visit
Admission: RE | Admit: 2023-11-22 | Discharge: 2023-11-22 | Disposition: A | Discharge status: Home / Self Care | Source: Ambulatory Visit | Attending: Radiation Oncology | Admitting: Radiation Oncology

## 2023-11-22 DIAGNOSIS — Z170 Estrogen receptor positive status [ER+]: Secondary | ICD-10-CM | POA: Insufficient documentation

## 2023-11-22 DIAGNOSIS — C50412 Malignant neoplasm of upper-outer quadrant of left female breast: Secondary | ICD-10-CM | POA: Insufficient documentation

## 2023-11-22 DIAGNOSIS — Z87891 Personal history of nicotine dependence: Secondary | ICD-10-CM | POA: Diagnosis not present

## 2023-11-22 DIAGNOSIS — Z510 Encounter for antineoplastic radiation therapy: Secondary | ICD-10-CM | POA: Insufficient documentation

## 2023-11-23 DIAGNOSIS — C50412 Malignant neoplasm of upper-outer quadrant of left female breast: Secondary | ICD-10-CM | POA: Diagnosis not present

## 2023-11-23 DIAGNOSIS — Z87891 Personal history of nicotine dependence: Secondary | ICD-10-CM | POA: Diagnosis not present

## 2023-11-23 DIAGNOSIS — Z170 Estrogen receptor positive status [ER+]: Secondary | ICD-10-CM | POA: Diagnosis not present

## 2023-11-23 DIAGNOSIS — Z510 Encounter for antineoplastic radiation therapy: Secondary | ICD-10-CM | POA: Diagnosis not present

## 2023-11-24 ENCOUNTER — Other Ambulatory Visit: Payer: Self-pay | Admitting: *Deleted

## 2023-11-24 DIAGNOSIS — C50412 Malignant neoplasm of upper-outer quadrant of left female breast: Secondary | ICD-10-CM

## 2023-11-29 ENCOUNTER — Encounter: Payer: Self-pay | Admitting: *Deleted

## 2023-11-29 ENCOUNTER — Ambulatory Visit
Admission: RE | Admit: 2023-11-29 | Discharge: 2023-11-29 | Disposition: A | Discharge status: Home / Self Care | Source: Ambulatory Visit | Attending: Radiation Oncology | Admitting: Radiation Oncology

## 2023-11-29 DIAGNOSIS — Z510 Encounter for antineoplastic radiation therapy: Secondary | ICD-10-CM | POA: Diagnosis not present

## 2023-11-29 DIAGNOSIS — Z87891 Personal history of nicotine dependence: Secondary | ICD-10-CM | POA: Diagnosis not present

## 2023-11-29 DIAGNOSIS — Z170 Estrogen receptor positive status [ER+]: Secondary | ICD-10-CM | POA: Diagnosis not present

## 2023-11-29 DIAGNOSIS — C50412 Malignant neoplasm of upper-outer quadrant of left female breast: Secondary | ICD-10-CM | POA: Diagnosis not present

## 2023-11-30 ENCOUNTER — Other Ambulatory Visit: Payer: Self-pay

## 2023-11-30 ENCOUNTER — Ambulatory Visit
Admission: RE | Admit: 2023-11-30 | Discharge: 2023-11-30 | Disposition: A | Discharge status: Home / Self Care | Source: Ambulatory Visit | Attending: Radiation Oncology | Admitting: Radiation Oncology

## 2023-11-30 DIAGNOSIS — Z510 Encounter for antineoplastic radiation therapy: Secondary | ICD-10-CM | POA: Diagnosis not present

## 2023-11-30 DIAGNOSIS — C50412 Malignant neoplasm of upper-outer quadrant of left female breast: Secondary | ICD-10-CM | POA: Diagnosis not present

## 2023-11-30 DIAGNOSIS — Z87891 Personal history of nicotine dependence: Secondary | ICD-10-CM | POA: Diagnosis not present

## 2023-11-30 DIAGNOSIS — Z170 Estrogen receptor positive status [ER+]: Secondary | ICD-10-CM | POA: Diagnosis not present

## 2023-11-30 LAB — RAD ONC ARIA SESSION SUMMARY
Course Elapsed Days: 0
Plan Fractions Treated to Date: 1
Plan Prescribed Dose Per Fraction: 2.66 Gy
Plan Total Fractions Prescribed: 16
Plan Total Prescribed Dose: 42.56 Gy
Reference Point Dosage Given to Date: 2.66 Gy
Reference Point Session Dosage Given: 2.66 Gy
Session Number: 1

## 2023-12-01 ENCOUNTER — Ambulatory Visit
Admission: RE | Admit: 2023-12-01 | Discharge: 2023-12-01 | Disposition: A | Discharge status: Home / Self Care | Source: Ambulatory Visit | Attending: Radiation Oncology | Admitting: Radiation Oncology

## 2023-12-01 ENCOUNTER — Other Ambulatory Visit: Payer: Self-pay

## 2023-12-01 ENCOUNTER — Encounter: Payer: Self-pay | Admitting: *Deleted

## 2023-12-01 ENCOUNTER — Ambulatory Visit: Payer: Self-pay

## 2023-12-01 DIAGNOSIS — C50412 Malignant neoplasm of upper-outer quadrant of left female breast: Secondary | ICD-10-CM | POA: Diagnosis not present

## 2023-12-01 DIAGNOSIS — Z170 Estrogen receptor positive status [ER+]: Secondary | ICD-10-CM | POA: Diagnosis not present

## 2023-12-01 DIAGNOSIS — Z510 Encounter for antineoplastic radiation therapy: Secondary | ICD-10-CM | POA: Diagnosis not present

## 2023-12-01 DIAGNOSIS — Z87891 Personal history of nicotine dependence: Secondary | ICD-10-CM | POA: Diagnosis not present

## 2023-12-01 DIAGNOSIS — Z0000 Encounter for general adult medical examination without abnormal findings: Secondary | ICD-10-CM

## 2023-12-01 LAB — RAD ONC ARIA SESSION SUMMARY
Course Elapsed Days: 1
Plan Fractions Treated to Date: 2
Plan Prescribed Dose Per Fraction: 2.66 Gy
Plan Total Fractions Prescribed: 16
Plan Total Prescribed Dose: 42.56 Gy
Reference Point Dosage Given to Date: 5.32 Gy
Reference Point Session Dosage Given: 2.66 Gy
Session Number: 2

## 2023-12-02 LAB — CMP12+LP+TP+TSH+6AC+CBC/D/PLT
ALT: 14 IU/L (ref 0–32)
AST: 19 IU/L (ref 0–40)
Albumin: 4.5 g/dL (ref 3.9–4.9)
Alkaline Phosphatase: 69 IU/L (ref 49–135)
BUN/Creatinine Ratio: 19 (ref 12–28)
BUN: 19 mg/dL (ref 8–27)
Basophils Absolute: 0 x10E3/uL (ref 0.0–0.2)
Basos: 1 %
Bilirubin Total: 0.5 mg/dL (ref 0.0–1.2)
Calcium: 9.7 mg/dL (ref 8.7–10.3)
Chloride: 104 mmol/L (ref 96–106)
Chol/HDL Ratio: 3.7 ratio (ref 0.0–4.4)
Cholesterol, Total: 119 mg/dL (ref 100–199)
Creatinine, Ser: 1.02 mg/dL — ABNORMAL HIGH (ref 0.57–1.00)
EOS (ABSOLUTE): 0.2 x10E3/uL (ref 0.0–0.4)
Eos: 3 %
Estimated CHD Risk: 0.7 times avg. (ref 0.0–1.0)
Free Thyroxine Index: 4 (ref 1.2–4.9)
GGT: 12 IU/L (ref 0–60)
Globulin, Total: 2.6 g/dL (ref 1.5–4.5)
Glucose: 105 mg/dL — ABNORMAL HIGH (ref 70–99)
HDL: 32 mg/dL — ABNORMAL LOW (ref >39)
Hematocrit: 43.6 % (ref 34.0–46.6)
Hemoglobin: 14.6 g/dL (ref 11.1–15.9)
Immature Grans (Abs): 0 x10E3/uL (ref 0.0–0.1)
Immature Granulocytes: 0 %
Iron: 94 ug/dL (ref 27–139)
LDH: 191 IU/L (ref 119–226)
LDL Chol Calc (NIH): 53 mg/dL (ref 0–99)
Lymphocytes Absolute: 1.9 x10E3/uL (ref 0.7–3.1)
Lymphs: 31 %
MCH: 30.3 pg (ref 26.6–33.0)
MCHC: 33.5 g/dL (ref 31.5–35.7)
MCV: 91 fL (ref 79–97)
Monocytes Absolute: 0.4 x10E3/uL (ref 0.1–0.9)
Monocytes: 7 %
Neutrophils Absolute: 3.7 x10E3/uL (ref 1.4–7.0)
Neutrophils: 58 %
Phosphorus: 3.9 mg/dL (ref 3.0–4.3)
Platelets: 320 x10E3/uL (ref 150–450)
Potassium: 4.5 mmol/L (ref 3.5–5.2)
RBC: 4.82 x10E6/uL (ref 3.77–5.28)
RDW: 13.3 % (ref 11.7–15.4)
Sodium: 138 mmol/L (ref 134–144)
T3 Uptake Ratio: 30 % (ref 24–39)
T4, Total: 13.3 ug/dL — ABNORMAL HIGH (ref 4.5–12.0)
TSH: 0.797 u[IU]/mL (ref 0.450–4.500)
Total Protein: 7.1 g/dL (ref 6.0–8.5)
Triglycerides: 212 mg/dL — ABNORMAL HIGH (ref 0–149)
Uric Acid: 5.5 mg/dL (ref 3.0–7.2)
VLDL Cholesterol Cal: 34 mg/dL (ref 5–40)
WBC: 6.3 x10E3/uL (ref 3.4–10.8)
eGFR: 62 mL/min/1.73 (ref >59)

## 2023-12-02 LAB — HGB A1C W/O EAG: Hgb A1c MFr Bld: 5.2 % (ref 4.8–5.6)

## 2023-12-04 ENCOUNTER — Ambulatory Visit
Admission: RE | Admit: 2023-12-04 | Discharge: 2023-12-04 | Disposition: A | Source: Ambulatory Visit | Attending: Radiation Oncology | Admitting: Radiation Oncology

## 2023-12-04 ENCOUNTER — Other Ambulatory Visit: Payer: Self-pay

## 2023-12-04 DIAGNOSIS — C50412 Malignant neoplasm of upper-outer quadrant of left female breast: Secondary | ICD-10-CM | POA: Diagnosis not present

## 2023-12-04 DIAGNOSIS — Z170 Estrogen receptor positive status [ER+]: Secondary | ICD-10-CM | POA: Diagnosis not present

## 2023-12-04 DIAGNOSIS — Z87891 Personal history of nicotine dependence: Secondary | ICD-10-CM | POA: Diagnosis not present

## 2023-12-04 DIAGNOSIS — Z510 Encounter for antineoplastic radiation therapy: Secondary | ICD-10-CM | POA: Diagnosis not present

## 2023-12-04 LAB — RAD ONC ARIA SESSION SUMMARY
Course Elapsed Days: 4
Plan Fractions Treated to Date: 3
Plan Prescribed Dose Per Fraction: 2.66 Gy
Plan Total Fractions Prescribed: 16
Plan Total Prescribed Dose: 42.56 Gy
Reference Point Dosage Given to Date: 7.98 Gy
Reference Point Session Dosage Given: 2.66 Gy
Session Number: 3

## 2023-12-05 ENCOUNTER — Ambulatory Visit
Admission: RE | Admit: 2023-12-05 | Discharge: 2023-12-05 | Disposition: A | Source: Ambulatory Visit | Attending: Radiation Oncology | Admitting: Radiation Oncology

## 2023-12-05 ENCOUNTER — Other Ambulatory Visit: Payer: Self-pay

## 2023-12-05 DIAGNOSIS — Z87891 Personal history of nicotine dependence: Secondary | ICD-10-CM | POA: Diagnosis not present

## 2023-12-05 DIAGNOSIS — Z510 Encounter for antineoplastic radiation therapy: Secondary | ICD-10-CM | POA: Diagnosis not present

## 2023-12-05 DIAGNOSIS — C50412 Malignant neoplasm of upper-outer quadrant of left female breast: Secondary | ICD-10-CM | POA: Diagnosis not present

## 2023-12-05 DIAGNOSIS — Z170 Estrogen receptor positive status [ER+]: Secondary | ICD-10-CM | POA: Diagnosis not present

## 2023-12-05 LAB — RAD ONC ARIA SESSION SUMMARY
Course Elapsed Days: 5
Plan Fractions Treated to Date: 4
Plan Prescribed Dose Per Fraction: 2.66 Gy
Plan Total Fractions Prescribed: 16
Plan Total Prescribed Dose: 42.56 Gy
Reference Point Dosage Given to Date: 10.64 Gy
Reference Point Session Dosage Given: 2.66 Gy
Session Number: 4

## 2023-12-06 ENCOUNTER — Inpatient Hospital Stay: Attending: Oncology

## 2023-12-06 ENCOUNTER — Ambulatory Visit
Admission: RE | Admit: 2023-12-06 | Discharge: 2023-12-06 | Disposition: A | Source: Ambulatory Visit | Attending: Radiation Oncology | Admitting: Radiation Oncology

## 2023-12-06 ENCOUNTER — Other Ambulatory Visit: Payer: Self-pay

## 2023-12-06 DIAGNOSIS — C50412 Malignant neoplasm of upper-outer quadrant of left female breast: Secondary | ICD-10-CM | POA: Diagnosis not present

## 2023-12-06 DIAGNOSIS — Z170 Estrogen receptor positive status [ER+]: Secondary | ICD-10-CM | POA: Insufficient documentation

## 2023-12-06 DIAGNOSIS — Z510 Encounter for antineoplastic radiation therapy: Secondary | ICD-10-CM | POA: Diagnosis not present

## 2023-12-06 DIAGNOSIS — Z87891 Personal history of nicotine dependence: Secondary | ICD-10-CM | POA: Diagnosis not present

## 2023-12-06 LAB — RAD ONC ARIA SESSION SUMMARY
Course Elapsed Days: 6
Plan Fractions Treated to Date: 5
Plan Prescribed Dose Per Fraction: 2.66 Gy
Plan Total Fractions Prescribed: 16
Plan Total Prescribed Dose: 42.56 Gy
Reference Point Dosage Given to Date: 13.3 Gy
Reference Point Session Dosage Given: 2.66 Gy
Session Number: 5

## 2023-12-06 LAB — CBC (CANCER CENTER ONLY)
HCT: 37.8 % (ref 36.0–46.0)
Hemoglobin: 13.2 g/dL (ref 12.0–15.0)
MCH: 29.9 pg (ref 26.0–34.0)
MCHC: 34.9 g/dL (ref 30.0–36.0)
MCV: 85.5 fL (ref 80.0–100.0)
Platelet Count: 291 K/uL (ref 150–400)
RBC: 4.42 MIL/uL (ref 3.87–5.11)
RDW: 12.7 % (ref 11.5–15.5)
WBC Count: 6.4 K/uL (ref 4.0–10.5)
nRBC: 0 % (ref 0.0–0.2)

## 2023-12-07 ENCOUNTER — Ambulatory Visit
Admission: RE | Admit: 2023-12-07 | Discharge: 2023-12-07 | Disposition: A | Discharge status: Home / Self Care | Source: Ambulatory Visit | Attending: Radiation Oncology | Admitting: Radiation Oncology

## 2023-12-07 ENCOUNTER — Other Ambulatory Visit: Payer: Self-pay

## 2023-12-07 DIAGNOSIS — C50412 Malignant neoplasm of upper-outer quadrant of left female breast: Secondary | ICD-10-CM | POA: Diagnosis not present

## 2023-12-07 DIAGNOSIS — Z170 Estrogen receptor positive status [ER+]: Secondary | ICD-10-CM | POA: Diagnosis not present

## 2023-12-07 DIAGNOSIS — Z510 Encounter for antineoplastic radiation therapy: Secondary | ICD-10-CM | POA: Diagnosis not present

## 2023-12-07 DIAGNOSIS — Z87891 Personal history of nicotine dependence: Secondary | ICD-10-CM | POA: Diagnosis not present

## 2023-12-07 LAB — RAD ONC ARIA SESSION SUMMARY
Course Elapsed Days: 7
Plan Fractions Treated to Date: 6
Plan Prescribed Dose Per Fraction: 2.66 Gy
Plan Total Fractions Prescribed: 16
Plan Total Prescribed Dose: 42.56 Gy
Reference Point Dosage Given to Date: 15.96 Gy
Reference Point Session Dosage Given: 2.66 Gy
Session Number: 6

## 2023-12-08 ENCOUNTER — Encounter: Payer: Commercial Managed Care - PPO | Admitting: Family Medicine

## 2023-12-08 ENCOUNTER — Ambulatory Visit
Admission: RE | Admit: 2023-12-08 | Discharge: 2023-12-08 | Disposition: A | Discharge status: Home / Self Care | Source: Ambulatory Visit | Attending: Radiation Oncology | Admitting: Radiation Oncology

## 2023-12-08 ENCOUNTER — Other Ambulatory Visit: Payer: Self-pay

## 2023-12-08 DIAGNOSIS — Z170 Estrogen receptor positive status [ER+]: Secondary | ICD-10-CM | POA: Diagnosis not present

## 2023-12-08 DIAGNOSIS — Z510 Encounter for antineoplastic radiation therapy: Secondary | ICD-10-CM | POA: Diagnosis not present

## 2023-12-08 DIAGNOSIS — C50412 Malignant neoplasm of upper-outer quadrant of left female breast: Secondary | ICD-10-CM | POA: Diagnosis not present

## 2023-12-08 DIAGNOSIS — Z87891 Personal history of nicotine dependence: Secondary | ICD-10-CM | POA: Diagnosis not present

## 2023-12-08 LAB — RAD ONC ARIA SESSION SUMMARY
Course Elapsed Days: 8
Plan Fractions Treated to Date: 7
Plan Prescribed Dose Per Fraction: 2.66 Gy
Plan Total Fractions Prescribed: 16
Plan Total Prescribed Dose: 42.56 Gy
Reference Point Dosage Given to Date: 18.62 Gy
Reference Point Session Dosage Given: 2.66 Gy
Session Number: 7

## 2023-12-11 ENCOUNTER — Other Ambulatory Visit: Payer: Self-pay

## 2023-12-11 ENCOUNTER — Other Ambulatory Visit: Payer: Self-pay | Admitting: *Deleted

## 2023-12-11 ENCOUNTER — Other Ambulatory Visit (HOSPITAL_COMMUNITY): Payer: Self-pay

## 2023-12-11 ENCOUNTER — Ambulatory Visit: Admitting: Family Medicine

## 2023-12-11 ENCOUNTER — Ambulatory Visit
Admission: RE | Admit: 2023-12-11 | Discharge: 2023-12-11 | Disposition: A | Source: Ambulatory Visit | Attending: Radiation Oncology | Admitting: Radiation Oncology

## 2023-12-11 VITALS — BP 133/83 | HR 73 | Temp 97.4°F | Ht 60.0 in | Wt 181.0 lb

## 2023-12-11 DIAGNOSIS — Z23 Encounter for immunization: Secondary | ICD-10-CM | POA: Diagnosis not present

## 2023-12-11 DIAGNOSIS — E039 Hypothyroidism, unspecified: Secondary | ICD-10-CM

## 2023-12-11 DIAGNOSIS — E66811 Obesity, class 1: Secondary | ICD-10-CM | POA: Diagnosis not present

## 2023-12-11 DIAGNOSIS — C50412 Malignant neoplasm of upper-outer quadrant of left female breast: Secondary | ICD-10-CM | POA: Diagnosis not present

## 2023-12-11 DIAGNOSIS — Z510 Encounter for antineoplastic radiation therapy: Secondary | ICD-10-CM | POA: Diagnosis not present

## 2023-12-11 DIAGNOSIS — Z0000 Encounter for general adult medical examination without abnormal findings: Secondary | ICD-10-CM

## 2023-12-11 DIAGNOSIS — E782 Mixed hyperlipidemia: Secondary | ICD-10-CM

## 2023-12-11 DIAGNOSIS — L680 Hirsutism: Secondary | ICD-10-CM

## 2023-12-11 DIAGNOSIS — Z170 Estrogen receptor positive status [ER+]: Secondary | ICD-10-CM

## 2023-12-11 DIAGNOSIS — I10 Essential (primary) hypertension: Secondary | ICD-10-CM | POA: Diagnosis not present

## 2023-12-11 DIAGNOSIS — Z87891 Personal history of nicotine dependence: Secondary | ICD-10-CM | POA: Diagnosis not present

## 2023-12-11 DIAGNOSIS — E6609 Other obesity due to excess calories: Secondary | ICD-10-CM

## 2023-12-11 DIAGNOSIS — Z6833 Body mass index (BMI) 33.0-33.9, adult: Secondary | ICD-10-CM

## 2023-12-11 LAB — RAD ONC ARIA SESSION SUMMARY
Course Elapsed Days: 11
Plan Fractions Treated to Date: 8
Plan Prescribed Dose Per Fraction: 2.66 Gy
Plan Total Fractions Prescribed: 16
Plan Total Prescribed Dose: 42.56 Gy
Reference Point Dosage Given to Date: 21.28 Gy
Reference Point Session Dosage Given: 2.66 Gy
Session Number: 8

## 2023-12-11 MED ORDER — ROSUVASTATIN CALCIUM 40 MG PO TABS
40.0000 mg | ORAL_TABLET | Freq: Every day | ORAL | 1 refills | Status: AC
  Filled 2023-12-11 – 2024-02-22 (×2): qty 90, 90d supply, fill #0

## 2023-12-11 MED ORDER — GEMFIBROZIL 600 MG PO TABS
600.0000 mg | ORAL_TABLET | Freq: Two times a day (BID) | ORAL | 1 refills | Status: AC
  Filled 2023-12-11: qty 180, 90d supply, fill #0

## 2023-12-11 MED ORDER — LEVOTHYROXINE SODIUM 137 MCG PO TABS
137.0000 ug | ORAL_TABLET | Freq: Every day | ORAL | 3 refills | Status: AC
  Filled 2023-12-11 – 2024-02-22 (×2): qty 90, 90d supply, fill #0

## 2023-12-11 MED ORDER — SPIRONOLACTONE 50 MG PO TABS
50.0000 mg | ORAL_TABLET | Freq: Every day | ORAL | 1 refills | Status: AC
  Filled 2023-12-11 – 2024-02-22 (×2): qty 90, 90d supply, fill #0

## 2023-12-11 NOTE — Assessment & Plan Note (Signed)
 Under good control on current regimen. Continue current regimen. Continue to monitor. Call with any concerns. Refills given. Labs checked at work and normal.

## 2023-12-11 NOTE — Assessment & Plan Note (Signed)
Following with oncology. Doing well. Continue to monitor.

## 2023-12-11 NOTE — Assessment & Plan Note (Signed)
 Has not been taking her wegovy  due to cancer diagnosis- will call if she wants to restart it and we'll restart 0.25mg 

## 2023-12-11 NOTE — Progress Notes (Signed)
 BP 133/83   Pulse 73   Temp (!) 97.4 F (36.3 C) (Oral)   Ht 5' (1.524 m)   Wt 181 lb (82.1 kg)   LMP 03/17/2016   SpO2 97%   BMI 35.35 kg/m    Subjective:    Patient ID: Gail Hunter, female    DOB: 07/15/1960, 63 y.o.   MRN: [id number]  HPI: Gail Hunter is a 63 y.o. female presenting on 12/11/2023 for comprehensive medical examination. Current medical complaints include:  HYPERTENSION / HYPERLIPIDEMIA Satisfied with current treatment? yes Duration of hypertension: chronic BP monitoring frequency: not checking BP medication side effects: no Past BP meds: spironalactone Duration of hyperlipidemia: chronic Cholesterol medication side effects: no Cholesterol supplements: none Past cholesterol medications: gemfibrozil , crestor  Medication compliance: excellent compliance Aspirin: no Recent stressors: no Recurrent headaches: no Visual changes: no Palpitations: no Dyspnea: no Chest pain: no Lower extremity edema: no Dizzy/lightheaded: no  HYPOTHYROIDISM Thyroid control status:controlled Satisfied with current treatment? yes Medication side effects: no Medication compliance: excellent compliance Recent dose adjustment:no Fatigue: no Cold intolerance: no Heat intolerance: no Weight gain: no Weight loss: no Constipation: no Diarrhea/loose stools: no Palpitations: no Lower extremity edema: no Anxiety/depressed mood: no  Menopausal Symptoms: no  Depression Screen done today and results listed below:     12/11/2023    3:18 PM 11/15/2023    1:43 PM 10/17/2023    2:51 PM 10/17/2023    2:37 PM 09/19/2023    4:10 PM  Depression screen PHQ 2/9  Decreased Interest 0 0 0 0 0  Down, Depressed, Hopeless 0 0 0 0 1  PHQ - 2 Score 0 0 0 0 1  Altered sleeping 0    0  Tired, decreased energy 0    0  Change in appetite 0    0  Feeling bad or failure about yourself  0    1  Trouble concentrating 0    0  Moving slowly or fidgety/restless 0    0  Suicidal thoughts 0     0  PHQ-9 Score 0    2   Difficult doing work/chores     Not difficult at all     Data saved with a previous flowsheet row definition    Past Medical History:  Past Medical History:  Diagnosis Date   Allergy    Anemia    Cancer (HCC)    History of chicken pox    History of measles    Hyperlipidemia    Hypertension    Thyroid disease     Surgical History:  Past Surgical History:  Procedure Laterality Date   AXILLARY SENTINEL NODE BIOPSY Left 10/25/2023   Procedure: BIOPSY, LYMPH NODE, SENTINEL, AXILLARY;  Surgeon: Rodolph Romano, MD;  Location: [facility];  Service: General;  Laterality: Left;   BREAST BIOPSY Left 10/09/2023   US  LT BREAST BX W LOC DEV 1ST LESION IMG BX SPEC US  GUIDE 10/09/2023 [facility]   BREAST LUMPECTOMY WITH RADIO FREQUENCY LOCALIZER Left 10/25/2023   Procedure: BREAST LUMPECTOMY WITH RADIO FREQUENCY LOCALIZER;  Surgeon: Rodolph Romano, MD;  Location: [facility];  Service: General;  Laterality: Left;   CESAREAN SECTION     CHOLECYSTECTOMY  02/2002   LAPAROSCOPIC ABDOMINAL EXPLORATION      Medications:  Current Outpatient Medications on File Prior to Visit  Medication Sig   Berberine Chloride (BERBERINE HCI) 500 MG CAPS Take by mouth 2 (two) times daily.   Calcium  Carb-Cholecalciferol (CALCIUM  500 + D3 PO) Take  1 tablet by mouth daily. (Patient taking differently: Take 1 tablet by mouth daily. 1200MG  D3, K2, B12)   Coenzyme Q10 200 MG capsule Take 200 mg by mouth daily.   ferrous sulfate 325 (65 FE) MG tablet Take 325 mg by mouth daily.   fluticasone (FLONASE) 50 MCG/ACT nasal spray Place 1 spray into both nostrils daily.   letrozole  (FEMARA ) 2.5 MG tablet Take 1 tablet (2.5 mg total) by mouth daily.   magnesium oxide (MAG-OX) 400 (240 Mg) MG tablet Take 400 mg by mouth daily. Jst taking the magnesium   Omega-3 Fatty Acids (FISH OIL) 300 MG CAPS Take 300 mg by mouth daily.   No current facility-administered medications on file prior to  visit.    Allergies:  Allergies  Allergen Reactions   Lisinopril  Cough    Social History:  Social History   Socioeconomic History   Marital status: Married    Spouse name: Garnette   Number of children: 1   Years of education: Not on file   Highest education level: Bachelor's degree (e.g., BA, AB, BS)  Occupational History   Not on file  Tobacco Use   Smoking status: Former    Current packs/day: 0.00    Types: Cigarettes    Quit date: 01/17/2000    Years since quitting: 23.9   Smokeless tobacco: Never  Vaping Use   Vaping status: Never Used  Substance and Sexual Activity   Alcohol use: Not Currently   Drug use: Never   Sexual activity: Not Currently    Birth control/protection: None  Other Topics Concern   Not on file  Social History Narrative   Not on file   Social Drivers of Health   Financial Resource Strain: Low Risk  (12/11/2023)   Overall Financial Resource Strain (CARDIA)    Difficulty of Paying Living Expenses: Not hard at all  Food Insecurity: No Food Insecurity (12/11/2023)   Hunger Vital Sign    Worried About Running Out of Food in the Last Year: Never true    Ran Out of Food in the Last Year: Never true  Transportation Needs: No Transportation Needs (12/11/2023)   PRAPARE - Administrator, Civil Service (Medical): No    Lack of Transportation (Non-Medical): No  Physical Activity: Insufficiently Active (12/11/2023)   Exercise Vital Sign    Days of Exercise per Week: 2 days    Minutes of Exercise per Session: 10 min  Stress: No Stress Concern Present (12/11/2023)   [employer] of Occupational Health - Occupational Stress Questionnaire    Feeling of Stress: Not at all  Social Connections: Socially Integrated (12/11/2023)   Social Connection and Isolation Panel    Frequency of Communication with Friends and Family: More than three times a week    Frequency of Social Gatherings with Friends and Family: More than three times a week     Attends Religious Services: More than 4 times per year    Active Member of [employer] or Organizations: Yes    Attends Banker Meetings: More than 4 times per year    Marital Status: Married  Catering Manager Violence: Not At Risk (10/17/2023)   Humiliation, Afraid, Rape, and Kick questionnaire    Fear of Current or Ex-Partner: No    Emotionally Abused: No    Physically Abused: No    Sexually Abused: No   Social History   Tobacco Use  Smoking Status Former   Current packs/day: 0.00   Types: Cigarettes  Quit date: 01/17/2000   Years since quitting: 23.9  Smokeless Tobacco Never   Social History   Substance and Sexual Activity  Alcohol Use Not Currently    Family History:  Family History  Problem Relation Age of Onset   Alzheimer's disease Mother    Thyroid disease Mother    Alzheimer's disease Father    Diabetes Sister    Hypertension Sister    Hyperlipidemia Sister    Hyperlipidemia Brother    Stroke Brother    Breast cancer Other        mat great uncle   Stroke Maternal Grandmother    Stroke Paternal Grandmother     Past medical history, surgical history, medications, allergies, family history and social history reviewed with patient today and changes made to appropriate areas of the chart.   Review of Systems  Constitutional: Negative.   HENT:  Positive for congestion. Negative for ear discharge, ear pain, hearing loss, nosebleeds, sinus pain, sore throat and tinnitus.   Eyes: Negative.   Respiratory: Negative.  Negative for stridor.   Cardiovascular: Negative.   Gastrointestinal: Negative.   Genitourinary: Negative.   Musculoskeletal: Negative.   Skin: Negative.   Neurological: Negative.   Endo/Heme/Allergies: Negative.   Psychiatric/Behavioral: Negative.     All other ROS negative except what is listed above and in the HPI.      Objective:    BP 133/83   Pulse 73   Temp (!) 97.4 F (36.3 C) (Oral)   Ht 5' (1.524 m)   Wt 181 lb  (82.1 kg)   LMP 03/17/2016   SpO2 97%   BMI 35.35 kg/m   Wt Readings from Last 3 Encounters:  12/11/23 181 lb (82.1 kg)  11/15/23 181 lb 1.6 oz (82.1 kg)  11/15/23 179 lb (81.2 kg)    Physical Exam Vitals and nursing note reviewed.  Constitutional:      General: She is not in acute distress.    Appearance: Normal appearance. She is not ill-appearing, toxic-appearing or diaphoretic.  HENT:     Head: Normocephalic and atraumatic.     Right Ear: Tympanic membrane, ear canal and external ear normal. There is no impacted cerumen.     Left Ear: Tympanic membrane, ear canal and external ear normal. There is no impacted cerumen.     Nose: Nose normal. No congestion or rhinorrhea.     Mouth/Throat:     Mouth: Mucous membranes are moist.     Pharynx: Oropharynx is clear. No oropharyngeal exudate or posterior oropharyngeal erythema.  Eyes:     General: No scleral icterus.       Right eye: No discharge.        Left eye: No discharge.     Extraocular Movements: Extraocular movements intact.     Conjunctiva/sclera: Conjunctivae normal.     Pupils: Pupils are equal, round, and reactive to light.  Neck:     Vascular: No carotid bruit.  Cardiovascular:     Rate and Rhythm: Normal rate and regular rhythm.     Pulses: Normal pulses.     Heart sounds: No murmur heard.    No friction rub. No gallop.  Pulmonary:     Effort: Pulmonary effort is normal. No respiratory distress.     Breath sounds: Normal breath sounds. No stridor. No wheezing, rhonchi or rales.  Chest:     Chest wall: No tenderness.  Abdominal:     General: Abdomen is flat. Bowel sounds are normal. There is no  distension.     Palpations: Abdomen is soft. There is no mass.     Tenderness: There is no abdominal tenderness. There is no right CVA tenderness, left CVA tenderness, guarding or rebound.     Hernia: No hernia is present.  Genitourinary:    Comments: Breast and pelvic exams deferred with shared decision  making Musculoskeletal:        General: No swelling, tenderness, deformity or signs of injury.     Cervical back: Normal range of motion and neck supple. No rigidity. No muscular tenderness.     Right lower leg: No edema.     Left lower leg: No edema.  Lymphadenopathy:     Cervical: No cervical adenopathy.  Skin:    General: Skin is warm and dry.     Capillary Refill: Capillary refill takes less than 2 seconds.     Coloration: Skin is not jaundiced or pale.     Findings: No bruising, erythema, lesion or rash.  Neurological:     General: No focal deficit present.     Mental Status: She is alert and oriented to person, place, and time. Mental status is at baseline.     Cranial Nerves: No cranial nerve deficit.     Sensory: No sensory deficit.     Motor: No weakness.     Coordination: Coordination normal.     Gait: Gait normal.     Deep Tendon Reflexes: Reflexes normal.  Psychiatric:        Mood and Affect: Mood normal.        Behavior: Behavior normal.        Thought Content: Thought content normal.        Judgment: Judgment normal.     Results for orders placed or performed in visit on 12/11/23  Rad Onc [facility] Session Summary   Collection Time: 12/11/23  2:30 PM  Result Value Ref Range   Course ID [id number]    Course Start Date 11/22/2023    Session Number 8    Course First Treatment Date 11/30/2023  3:32 PM    Course Last Treatment Date 12/11/2023  2:29 PM    Course Elapsed Days 11    Reference Point ID Breast_L    Reference Point Dosage Given to Date 21.28 Gy   Reference Point Session Dosage Given 2.66 Gy   Plan ID Breast_L_BH    Plan Fractions Treated to Date 8    Plan Total Fractions Prescribed 16    Plan Prescribed Dose Per Fraction 2.66 Gy   Plan Total Prescribed Dose 42.560000 Gy   Plan Primary Reference Point Breast_L       Assessment & Plan:   Problem List Items Addressed This Visit       Cardiovascular and Mediastinum   Primary hypertension   Under good  control on current regimen. Continue current regimen. Continue to monitor. Call with any concerns. Refills given. Labs checked at work and normal.        Relevant Medications   gemfibrozil  (LOPID ) 600 MG tablet   rosuvastatin  (CRESTOR ) 40 MG tablet   spironolactone  (ALDACTONE ) 50 MG tablet   Other Relevant Orders   Urine Microalbumin w/creat. ratio     Endocrine   Hypothyroidism (Chronic)   Under good control on current regimen. Continue current regimen. Continue to monitor. Call with any concerns. Refills given. Labs checked at work and normal.       Relevant Medications   levothyroxine  (SYNTHROID ) 137 MCG tablet  Other   Hypercholesterolemia with hypertriglyceridemia (Chronic)   Under good control on current regimen. Continue current regimen. Continue to monitor. Call with any concerns. Refills given. Labs checked at work and normal.        Relevant Medications   gemfibrozil  (LOPID ) 600 MG tablet   rosuvastatin  (CRESTOR ) 40 MG tablet   spironolactone  (ALDACTONE ) 50 MG tablet   Class 1 obesity due to excess calories without serious comorbidity with body mass index (BMI) of 33.0 to 33.9 in adult   Has not been taking her wegovy  due to cancer diagnosis- will call if she wants to restart it and we'll restart 0.25mg        Malignant neoplasm of upper-outer quadrant of left breast in female, estrogen receptor positive [facility])   Following with oncology. Doing well. Continue to monitor.       Other Visit Diagnoses       Routine general medical examination at a health care facility    -  Primary   Vaccines up to date. Screening labs checked today. Pap, mammo and colonoscopy up to date. Continue diet and exercise. Call with any concerns.     Hirsutism       Relevant Medications   spironolactone  (ALDACTONE ) 50 MG tablet     Need for pneumococcal 20-valent conjugate vaccination       Prevnar given today.   Relevant Orders   Pneumococcal conjugate vaccine 20-valent (Prevnar 20)         Follow up plan: Return in about 6 months (around 06/09/2024).   LABORATORY TESTING:  - Pap smear: up to date  IMMUNIZATIONS:   - Tdap: Tetanus vaccination status reviewed: last tetanus booster within 10 years. - Influenza: Up to date - Prevnar: Administered today - COVID: Refused - HPV: Not applicable - Shingrix  vaccine: Up to date  SCREENING: -Mammogram: Up to date  - Colonoscopy: Up to date   PATIENT COUNSELING:   Advised to take 1 mg of folate supplement per day if capable of pregnancy.   Sexuality: Discussed sexually transmitted diseases, partner selection, use of condoms, avoidance of unintended pregnancy  and contraceptive alternatives.   Advised to avoid cigarette smoking.  I discussed with the patient that most people either abstain from alcohol or drink within safe limits (<=14/week and <=4 drinks/occasion for males, <=7/weeks and <= 3 drinks/occasion for females) and that the risk for alcohol disorders and other health effects rises proportionally with the number of drinks per week and how often a drinker exceeds daily limits.  Discussed cessation/primary prevention of drug use and availability of treatment for abuse.   Diet: Encouraged to adjust caloric intake to maintain  or achieve ideal body weight, to reduce intake of dietary saturated fat and total fat, to limit sodium intake by avoiding high sodium foods and not adding table salt, and to maintain adequate dietary potassium and calcium  preferably from fresh fruits, vegetables, and low-fat dairy products.    stressed the importance of regular exercise  Injury prevention: Discussed safety belts, safety helmets, smoke detector, smoking near bedding or upholstery.   Dental health: Discussed importance of regular tooth brushing, flossing, and dental visits.    NEXT PREVENTATIVE PHYSICAL DUE IN 1 YEAR. Return in about 6 months (around 06/09/2024).

## 2023-12-12 ENCOUNTER — Ambulatory Visit
Admission: RE | Admit: 2023-12-12 | Discharge: 2023-12-12 | Disposition: A | Source: Ambulatory Visit | Attending: Radiation Oncology | Admitting: Radiation Oncology

## 2023-12-12 ENCOUNTER — Other Ambulatory Visit: Payer: Self-pay

## 2023-12-12 DIAGNOSIS — Z170 Estrogen receptor positive status [ER+]: Secondary | ICD-10-CM | POA: Diagnosis not present

## 2023-12-12 DIAGNOSIS — Z87891 Personal history of nicotine dependence: Secondary | ICD-10-CM | POA: Diagnosis not present

## 2023-12-12 DIAGNOSIS — Z510 Encounter for antineoplastic radiation therapy: Secondary | ICD-10-CM | POA: Diagnosis not present

## 2023-12-12 DIAGNOSIS — C50412 Malignant neoplasm of upper-outer quadrant of left female breast: Secondary | ICD-10-CM | POA: Diagnosis not present

## 2023-12-12 LAB — RAD ONC ARIA SESSION SUMMARY
Course Elapsed Days: 12
Plan Fractions Treated to Date: 9
Plan Prescribed Dose Per Fraction: 2.66 Gy
Plan Total Fractions Prescribed: 16
Plan Total Prescribed Dose: 42.56 Gy
Reference Point Dosage Given to Date: 23.94 Gy
Reference Point Session Dosage Given: 2.66 Gy
Session Number: 9

## 2023-12-13 ENCOUNTER — Other Ambulatory Visit: Payer: Self-pay

## 2023-12-13 ENCOUNTER — Ambulatory Visit
Admission: RE | Admit: 2023-12-13 | Discharge: 2023-12-13 | Disposition: A | Source: Ambulatory Visit | Attending: Radiation Oncology | Admitting: Radiation Oncology

## 2023-12-13 DIAGNOSIS — Z510 Encounter for antineoplastic radiation therapy: Secondary | ICD-10-CM | POA: Diagnosis not present

## 2023-12-13 DIAGNOSIS — C50412 Malignant neoplasm of upper-outer quadrant of left female breast: Secondary | ICD-10-CM | POA: Diagnosis not present

## 2023-12-13 DIAGNOSIS — Z87891 Personal history of nicotine dependence: Secondary | ICD-10-CM | POA: Diagnosis not present

## 2023-12-13 DIAGNOSIS — Z170 Estrogen receptor positive status [ER+]: Secondary | ICD-10-CM | POA: Diagnosis not present

## 2023-12-13 LAB — RAD ONC ARIA SESSION SUMMARY
Course Elapsed Days: 13
Plan Fractions Treated to Date: 10
Plan Prescribed Dose Per Fraction: 2.66 Gy
Plan Total Fractions Prescribed: 16
Plan Total Prescribed Dose: 42.56 Gy
Reference Point Dosage Given to Date: 26.6 Gy
Reference Point Session Dosage Given: 2.66 Gy
Session Number: 10

## 2023-12-18 ENCOUNTER — Ambulatory Visit
Admission: RE | Admit: 2023-12-18 | Discharge: 2023-12-18 | Disposition: A | Discharge status: Home / Self Care | Source: Ambulatory Visit | Attending: Radiation Oncology | Admitting: Radiation Oncology

## 2023-12-18 ENCOUNTER — Other Ambulatory Visit: Payer: Self-pay

## 2023-12-18 DIAGNOSIS — C50412 Malignant neoplasm of upper-outer quadrant of left female breast: Secondary | ICD-10-CM | POA: Diagnosis not present

## 2023-12-18 DIAGNOSIS — Z170 Estrogen receptor positive status [ER+]: Secondary | ICD-10-CM | POA: Diagnosis not present

## 2023-12-18 DIAGNOSIS — Z1721 Progesterone receptor positive status: Secondary | ICD-10-CM | POA: Insufficient documentation

## 2023-12-18 DIAGNOSIS — Z510 Encounter for antineoplastic radiation therapy: Secondary | ICD-10-CM | POA: Diagnosis not present

## 2023-12-18 LAB — RAD ONC ARIA SESSION SUMMARY
Course Elapsed Days: 18
Plan Fractions Treated to Date: 11
Plan Prescribed Dose Per Fraction: 2.66 Gy
Plan Total Fractions Prescribed: 16
Plan Total Prescribed Dose: 42.56 Gy
Reference Point Dosage Given to Date: 29.26 Gy
Reference Point Session Dosage Given: 2.66 Gy
Session Number: 11

## 2023-12-19 ENCOUNTER — Ambulatory Visit
Admission: RE | Admit: 2023-12-19 | Discharge: 2023-12-19 | Disposition: A | Source: Ambulatory Visit | Attending: Radiation Oncology | Admitting: Radiation Oncology

## 2023-12-19 ENCOUNTER — Other Ambulatory Visit: Payer: Self-pay

## 2023-12-19 DIAGNOSIS — Z170 Estrogen receptor positive status [ER+]: Secondary | ICD-10-CM | POA: Diagnosis not present

## 2023-12-19 DIAGNOSIS — C50412 Malignant neoplasm of upper-outer quadrant of left female breast: Secondary | ICD-10-CM | POA: Diagnosis not present

## 2023-12-19 DIAGNOSIS — Z510 Encounter for antineoplastic radiation therapy: Secondary | ICD-10-CM | POA: Diagnosis not present

## 2023-12-19 LAB — RAD ONC ARIA SESSION SUMMARY
Course Elapsed Days: 19
Plan Fractions Treated to Date: 12
Plan Prescribed Dose Per Fraction: 2.66 Gy
Plan Total Fractions Prescribed: 16
Plan Total Prescribed Dose: 42.56 Gy
Reference Point Dosage Given to Date: 31.92 Gy
Reference Point Session Dosage Given: 2.66 Gy
Session Number: 12

## 2023-12-20 ENCOUNTER — Inpatient Hospital Stay

## 2023-12-20 ENCOUNTER — Other Ambulatory Visit: Payer: Self-pay

## 2023-12-20 ENCOUNTER — Ambulatory Visit
Admission: RE | Admit: 2023-12-20 | Discharge: 2023-12-20 | Disposition: A | Source: Ambulatory Visit | Attending: Radiation Oncology | Admitting: Radiation Oncology

## 2023-12-20 DIAGNOSIS — C50412 Malignant neoplasm of upper-outer quadrant of left female breast: Secondary | ICD-10-CM | POA: Diagnosis not present

## 2023-12-20 DIAGNOSIS — Z170 Estrogen receptor positive status [ER+]: Secondary | ICD-10-CM | POA: Diagnosis not present

## 2023-12-20 DIAGNOSIS — Z510 Encounter for antineoplastic radiation therapy: Secondary | ICD-10-CM | POA: Diagnosis not present

## 2023-12-20 LAB — RAD ONC ARIA SESSION SUMMARY
Course Elapsed Days: 20
Plan Fractions Treated to Date: 13
Plan Prescribed Dose Per Fraction: 2.66 Gy
Plan Total Fractions Prescribed: 16
Plan Total Prescribed Dose: 42.56 Gy
Reference Point Dosage Given to Date: 34.58 Gy
Reference Point Session Dosage Given: 2.66 Gy
Session Number: 13

## 2023-12-21 ENCOUNTER — Ambulatory Visit
Admission: RE | Admit: 2023-12-21 | Discharge: 2023-12-21 | Disposition: A | Source: Ambulatory Visit | Attending: Radiation Oncology | Admitting: Radiation Oncology

## 2023-12-21 ENCOUNTER — Inpatient Hospital Stay: Attending: Oncology

## 2023-12-21 ENCOUNTER — Other Ambulatory Visit: Payer: Self-pay

## 2023-12-21 DIAGNOSIS — Z510 Encounter for antineoplastic radiation therapy: Secondary | ICD-10-CM | POA: Diagnosis not present

## 2023-12-21 DIAGNOSIS — Z170 Estrogen receptor positive status [ER+]: Secondary | ICD-10-CM | POA: Diagnosis not present

## 2023-12-21 DIAGNOSIS — C50412 Malignant neoplasm of upper-outer quadrant of left female breast: Secondary | ICD-10-CM | POA: Diagnosis not present

## 2023-12-21 LAB — RAD ONC ARIA SESSION SUMMARY
Course Elapsed Days: 21
Plan Fractions Treated to Date: 14
Plan Prescribed Dose Per Fraction: 2.66 Gy
Plan Total Fractions Prescribed: 16
Plan Total Prescribed Dose: 42.56 Gy
Reference Point Dosage Given to Date: 37.24 Gy
Reference Point Session Dosage Given: 2.66 Gy
Session Number: 14

## 2023-12-21 LAB — CBC (CANCER CENTER ONLY)
HCT: 40 % (ref 36.0–46.0)
Hemoglobin: 14 g/dL (ref 12.0–15.0)
MCH: 30 pg (ref 26.0–34.0)
MCHC: 35 g/dL (ref 30.0–36.0)
MCV: 85.7 fL (ref 80.0–100.0)
Platelet Count: 267 K/uL (ref 150–400)
RBC: 4.67 MIL/uL (ref 3.87–5.11)
RDW: 12.7 % (ref 11.5–15.5)
WBC Count: 7.5 K/uL (ref 4.0–10.5)
nRBC: 0 % (ref 0.0–0.2)

## 2023-12-22 ENCOUNTER — Ambulatory Visit
Admission: RE | Admit: 2023-12-22 | Discharge: 2023-12-22 | Disposition: A | Source: Ambulatory Visit | Attending: Radiation Oncology | Admitting: Radiation Oncology

## 2023-12-22 ENCOUNTER — Other Ambulatory Visit: Payer: Self-pay

## 2023-12-22 DIAGNOSIS — C50412 Malignant neoplasm of upper-outer quadrant of left female breast: Secondary | ICD-10-CM | POA: Diagnosis not present

## 2023-12-22 DIAGNOSIS — Z170 Estrogen receptor positive status [ER+]: Secondary | ICD-10-CM | POA: Diagnosis not present

## 2023-12-22 DIAGNOSIS — Z510 Encounter for antineoplastic radiation therapy: Secondary | ICD-10-CM | POA: Diagnosis not present

## 2023-12-22 LAB — RAD ONC ARIA SESSION SUMMARY
Course Elapsed Days: 22
Plan Fractions Treated to Date: 15
Plan Prescribed Dose Per Fraction: 2.66 Gy
Plan Total Fractions Prescribed: 16
Plan Total Prescribed Dose: 42.56 Gy
Reference Point Dosage Given to Date: 39.9 Gy
Reference Point Session Dosage Given: 2.66 Gy
Session Number: 15

## 2023-12-25 ENCOUNTER — Ambulatory Visit
Admission: RE | Admit: 2023-12-25 | Discharge: 2023-12-25 | Disposition: A | Source: Ambulatory Visit | Attending: Radiation Oncology | Admitting: Radiation Oncology

## 2023-12-25 ENCOUNTER — Other Ambulatory Visit: Payer: Self-pay

## 2023-12-25 DIAGNOSIS — C50412 Malignant neoplasm of upper-outer quadrant of left female breast: Secondary | ICD-10-CM | POA: Diagnosis not present

## 2023-12-25 DIAGNOSIS — Z510 Encounter for antineoplastic radiation therapy: Secondary | ICD-10-CM | POA: Diagnosis not present

## 2023-12-25 DIAGNOSIS — Z170 Estrogen receptor positive status [ER+]: Secondary | ICD-10-CM | POA: Diagnosis not present

## 2023-12-25 LAB — RAD ONC ARIA SESSION SUMMARY
Course Elapsed Days: 25
Plan Fractions Treated to Date: 16
Plan Prescribed Dose Per Fraction: 2.66 Gy
Plan Total Fractions Prescribed: 16
Plan Total Prescribed Dose: 42.56 Gy
Reference Point Dosage Given to Date: 42.56 Gy
Reference Point Session Dosage Given: 2.66 Gy
Session Number: 16

## 2023-12-26 ENCOUNTER — Ambulatory Visit
Admission: RE | Admit: 2023-12-26 | Discharge: 2023-12-26 | Disposition: A | Source: Ambulatory Visit | Attending: Radiation Oncology | Admitting: Radiation Oncology

## 2023-12-26 ENCOUNTER — Other Ambulatory Visit: Payer: Self-pay

## 2023-12-26 DIAGNOSIS — Z510 Encounter for antineoplastic radiation therapy: Secondary | ICD-10-CM | POA: Diagnosis not present

## 2023-12-26 LAB — RAD ONC ARIA SESSION SUMMARY
Course Elapsed Days: 26
Plan Fractions Treated to Date: 1
Plan Prescribed Dose Per Fraction: 2 Gy
Plan Total Fractions Prescribed: 5
Plan Total Prescribed Dose: 10 Gy
Reference Point Dosage Given to Date: 2 Gy
Reference Point Session Dosage Given: 2 Gy
Session Number: 17

## 2023-12-27 ENCOUNTER — Other Ambulatory Visit: Payer: Self-pay

## 2023-12-27 ENCOUNTER — Ambulatory Visit
Admission: RE | Admit: 2023-12-27 | Discharge: 2023-12-27 | Disposition: A | Discharge status: Home / Self Care | Source: Ambulatory Visit | Attending: Radiation Oncology | Admitting: Radiation Oncology

## 2023-12-27 DIAGNOSIS — Z510 Encounter for antineoplastic radiation therapy: Secondary | ICD-10-CM | POA: Diagnosis not present

## 2023-12-27 LAB — RAD ONC ARIA SESSION SUMMARY
Course Elapsed Days: 27
Plan Fractions Treated to Date: 2
Plan Prescribed Dose Per Fraction: 2 Gy
Plan Total Fractions Prescribed: 5
Plan Total Prescribed Dose: 10 Gy
Reference Point Dosage Given to Date: 4 Gy
Reference Point Session Dosage Given: 2 Gy
Session Number: 18

## 2023-12-28 ENCOUNTER — Ambulatory Visit
Admission: RE | Admit: 2023-12-28 | Discharge: 2023-12-28 | Disposition: A | Discharge status: Home / Self Care | Source: Ambulatory Visit | Attending: Radiation Oncology | Admitting: Radiation Oncology

## 2023-12-28 ENCOUNTER — Other Ambulatory Visit: Payer: Self-pay

## 2023-12-28 DIAGNOSIS — Z510 Encounter for antineoplastic radiation therapy: Secondary | ICD-10-CM | POA: Diagnosis not present

## 2023-12-28 LAB — RAD ONC ARIA SESSION SUMMARY
Course Elapsed Days: 28
Plan Fractions Treated to Date: 3
Plan Prescribed Dose Per Fraction: 2 Gy
Plan Total Fractions Prescribed: 5
Plan Total Prescribed Dose: 10 Gy
Reference Point Dosage Given to Date: 6 Gy
Reference Point Session Dosage Given: 2 Gy
Session Number: 19

## 2023-12-29 ENCOUNTER — Other Ambulatory Visit: Payer: Self-pay

## 2023-12-29 ENCOUNTER — Ambulatory Visit
Admission: RE | Admit: 2023-12-29 | Discharge: 2023-12-29 | Attending: Radiation Oncology | Admitting: Radiation Oncology

## 2023-12-29 DIAGNOSIS — Z170 Estrogen receptor positive status [ER+]: Secondary | ICD-10-CM | POA: Diagnosis not present

## 2023-12-29 DIAGNOSIS — Z510 Encounter for antineoplastic radiation therapy: Secondary | ICD-10-CM | POA: Diagnosis not present

## 2023-12-29 DIAGNOSIS — C50412 Malignant neoplasm of upper-outer quadrant of left female breast: Secondary | ICD-10-CM | POA: Diagnosis not present

## 2023-12-29 LAB — RAD ONC ARIA SESSION SUMMARY
Course Elapsed Days: 29
Plan Fractions Treated to Date: 4
Plan Prescribed Dose Per Fraction: 2 Gy
Plan Total Fractions Prescribed: 5
Plan Total Prescribed Dose: 10 Gy
Reference Point Dosage Given to Date: 8 Gy
Reference Point Session Dosage Given: 2 Gy
Session Number: 20

## 2024-01-01 ENCOUNTER — Other Ambulatory Visit: Payer: Self-pay

## 2024-01-01 ENCOUNTER — Ambulatory Visit
Admission: RE | Admit: 2024-01-01 | Discharge: 2024-01-01 | Disposition: A | Source: Ambulatory Visit | Attending: Radiation Oncology | Admitting: Radiation Oncology

## 2024-01-01 DIAGNOSIS — Z510 Encounter for antineoplastic radiation therapy: Secondary | ICD-10-CM | POA: Diagnosis not present

## 2024-01-01 LAB — RAD ONC ARIA SESSION SUMMARY
Course Elapsed Days: 32
Plan Fractions Treated to Date: 5
Plan Prescribed Dose Per Fraction: 2 Gy
Plan Total Fractions Prescribed: 5
Plan Total Prescribed Dose: 10 Gy
Reference Point Dosage Given to Date: 10 Gy
Reference Point Session Dosage Given: 2 Gy
Session Number: 21

## 2024-01-02 ENCOUNTER — Encounter: Payer: Self-pay | Admitting: *Deleted

## 2024-01-02 NOTE — Radiation Completion Notes (Signed)
 Patient Name: Gail, Hunter MRN: [id number] Date of Birth: Mar 15, 1960 Referring Physician: ANNAH SKENE, M.D. Date of Service: 2024-01-02 Radiation Oncologist: Marcey Penton, M.D. [facility] [place]                             RADIATION ONCOLOGY END OF TREATMENT NOTE     Diagnosis: C50.412 Malignant neoplasm of upper-outer quadrant of left female breast Staging on 2023-10-22: Malignant neoplasm of upper-outer quadrant of left breast in female, estrogen receptor positive (HCC) T=cT1b, N=cN0, M=cM0 Intent: Curative     HPI: Patient is a 63 year old female who presents with an abnormal mammogram of her left breast.  There originally was architectural distortion on screening mammogram confirmed on diagnostic mammogram to be a 1 cm suspicious mass at the 2 o'clock position of the left breast 5 cm from nipple.  No abnormal left left axillary lymph nodes were noted.  She underwent ultrasound-guided biopsy which was positive for ER/PR positive HER2/neu overexpressed lobular carcinoma.  MRI of her bilateral breasts showed fluid collection after wide local excision consistent with seroma.  No evidence of right breast malignancy.  At the time of wide local excision tumor measured 1.5 cm margins were clear tumor was at lobular carcinoma overall grade 2.  There was lobular carcinoma in situ also present.  2 sentinel lymph nodes were negative for metastatic disease.  Her Oncotype DX was 10 and she will not receive systemic treatment.  She is seen today for evaluation she is doing well.  She specifically denies breast tenderness cough or bone pain.      ==========DELIVERED PLANS==========  First Treatment Date: 2023-11-30 Last Treatment Date: 2024-01-01   Plan Name: Breast_L_BH Site: Breast, Left Technique: 3D Mode: Photon Dose Per Fraction: 2.66 Gy Prescribed Dose (Delivered / Prescribed): 42.56 Gy / 42.56 Gy Prescribed Fxs (Delivered / Prescribed): 16 / 16   Plan Name:  Breast_L_Bst Site: Breast, Left Technique: 3D Mode: Photon Dose Per Fraction: 2 Gy Prescribed Dose (Delivered / Prescribed): 10 Gy / 10 Gy Prescribed Fxs (Delivered / Prescribed): 5 / 5     ==========ON TREATMENT VISIT DATES========== 2023-12-05, 2023-12-12, 2023-12-19, 2023-12-26     ==========UPCOMING VISITS========== 03/04/2024 [facility] ON[facility] SOZO SCREEN Georgina Shasta POUR, RN  03/04/2024 [facility] ON[facility] EST PT Skene Annah BROCKS, MD  01/31/2024 [facility] RAD ONCOLOGY FOLLOW UP 30 Penton Marcey, MD        ==========APPENDIX - ON TREATMENT VISIT NOTES==========   See weekly On Treatment Notes in Epic for details in the Media tab (listed as Progress notes on the On Treatment Visit Dates listed above).

## 2024-01-31 ENCOUNTER — Ambulatory Visit
Admission: RE | Admit: 2024-01-31 | Discharge: 2024-01-31 | Disposition: A | Discharge status: Home / Self Care | Source: Ambulatory Visit | Attending: Radiation Oncology | Admitting: Radiation Oncology

## 2024-01-31 VITALS — BP 173/71 | HR 89

## 2024-01-31 DIAGNOSIS — C50912 Malignant neoplasm of unspecified site of left female breast: Secondary | ICD-10-CM

## 2024-01-31 NOTE — Progress Notes (Signed)
 Radiation Oncology Follow up Note  Name: Gail Hunter   Date:   01/31/2024 MRN:  [id number] DOB: 02-17-1960    This 64 y.o. female presents to the clinic today for 1 month follow-up status post whole breast radiation to her left breast for stage pT1a (pT1c N0 M0) invasive lobular carcinoma status post wide local excision and sentinel node biopsy ER/PR positive.  REFERRING PROVIDER: Vicci Duwaine SQUIBB, DO  HPI: Patient is a 64 year old female now seen at 1 month having completed whole breast radiation to her left breast for invasive lobular carcinoma stage T1a ER positive.  Seen today in routine follow-up she is doing well.  She specifically denies breast tenderness cough or bone pain..  She has been started on letrozole  tolerating that well without side effect.  COMPLICATIONS OF TREATMENT: none  FOLLOW UP COMPLIANCE: keeps appointments   PHYSICAL EXAM:  LMP 03/17/2016  Lungs are clear to A&P cardiac examination essentially unremarkable with regular rate and rhythm. No dominant mass or nodularity is noted in either breast in 2 positions examined. Incision is well-healed. No axillary or supraclavicular adenopathy is appreciated. Cosmetic result is excellent.  Well-developed well-nourished patient in NAD. HEENT reveals PERLA, EOMI, discs not visualized.  Oral cavity is clear. No oral mucosal lesions are identified. Neck is clear without evidence of cervical or supraclavicular adenopathy. Lungs are clear to A&P. Cardiac examination is essentially unremarkable with regular rate and rhythm without murmur rub or thrill. Abdomen is benign with no organomegaly or masses noted. Motor sensory and DTR levels are equal and symmetric in the upper and lower extremities. Cranial nerves II through XII are grossly intact. Proprioception is intact. No peripheral adenopathy or edema is identified. No motor or sensory levels are noted. Crude visual fields are within normal range.  RADIOLOGY RESULTS: No current films  for review  PLAN: Present time patient is doing well now out 1 month from whole breast radiation and pleased with her overall progress.  I have asked to see her back in 6 months for follow-up.  She continues close follow-up care with medical oncology.  Patient knows to call with any concerns.  I would like to take this opportunity to thank you for allowing me to participate in the care of your patient.SABRA Marcey Penton, MD

## 2024-02-13 ENCOUNTER — Ambulatory Visit
Admission: RE | Admit: 2024-02-13 | Discharge: 2024-02-13 | Disposition: A | Discharge status: Home / Self Care | Source: Ambulatory Visit | Attending: Oncology | Admitting: Oncology

## 2024-02-13 DIAGNOSIS — C50412 Malignant neoplasm of upper-outer quadrant of left female breast: Secondary | ICD-10-CM | POA: Insufficient documentation

## 2024-02-13 DIAGNOSIS — Z170 Estrogen receptor positive status [ER+]: Secondary | ICD-10-CM | POA: Diagnosis present

## 2024-02-22 ENCOUNTER — Other Ambulatory Visit (HOSPITAL_COMMUNITY): Payer: Self-pay

## 2024-02-22 ENCOUNTER — Telehealth: Admitting: Family Medicine

## 2024-02-22 ENCOUNTER — Encounter: Payer: Self-pay | Admitting: Family Medicine

## 2024-02-22 DIAGNOSIS — Z6835 Body mass index (BMI) 35.0-35.9, adult: Secondary | ICD-10-CM

## 2024-02-22 MED ORDER — WEGOVY 4 MG PO TABS: 4.0000 mg | ORAL_TABLET | Freq: Every day | ORAL | 2 refills | Status: AC

## 2024-02-22 MED ORDER — WEGOVY 1.5 MG PO TABS: 1.5000 mg | ORAL_TABLET | Freq: Every day | ORAL | 0 refills | Status: AC

## 2024-02-22 NOTE — Assessment & Plan Note (Signed)
 Due to HTN and HLD. Would like to start oral wegovy . Rx sent to [facility]. Recheck tolerance in about 6 weeks. Call with any concerns.

## 2024-02-22 NOTE — Telephone Encounter (Signed)
 OK to put in virtual visit today (OK to double book wherever) to restart as long as she can get a weight (need weight within 45 days for insurance to cover)

## 2024-02-22 NOTE — Progress Notes (Signed)
 "  Ht 5' (1.524 m)   Wt 182 lb (82.6 kg)   LMP 03/17/2016   BMI 35.54 kg/m    Subjective:    Patient ID: Gail Hunter, female    DOB: Mar 09, 1960, 64 y.o.   MRN: [id number]  HPI: Gail Hunter is a 64 y.o. female  Chief Complaint  Patient presents with   Obesity    Was on wegovy  up until cancer diagnoses was told she could not be on the medicine for the week. Thought she had refills in the system but can not get it and would like to restart the wegovy     OBESITY- had to stop wegovy  due to breast cancer diagnosis. Had done really well with it, but has gained weight back since coming off of it and would like to restart Duration: chronic Previous attempts at weight loss: yes- diet, exercise, portion control, wegovy  Complications of obesity: IFG, HTN, HLD Peak weight: 184lbs Weight loss goal: to be healthy Weight loss to date: 2lb Requesting obesity pharmacotherapy: yes Current weight loss supplements/medications: no Previous weight loss supplements/meds: yes  Relevant past medical, surgical, family and social history reviewed and updated as indicated. Interim medical history since our last visit reviewed. Allergies and medications reviewed and updated.  Review of Systems  Constitutional: Negative.   Respiratory: Negative.    Cardiovascular: Negative.   Musculoskeletal: Negative.   Neurological: Negative.   Psychiatric/Behavioral: Negative.      Per HPI unless specifically indicated above     Objective:    Ht 5' (1.524 m)   Wt 182 lb (82.6 kg)   LMP 03/17/2016   BMI 35.54 kg/m   Wt Readings from Last 3 Encounters:  02/22/24 182 lb (82.6 kg)  12/11/23 181 lb (82.1 kg)  11/15/23 181 lb 1.6 oz (82.1 kg)    Physical Exam Vitals and nursing note reviewed.  Constitutional:      General: She is not in acute distress.    Appearance: Normal appearance. She is not ill-appearing, toxic-appearing or diaphoretic.  HENT:     Head: Normocephalic and atraumatic.     Right  Ear: External ear normal.     Left Ear: External ear normal.     Nose: Nose normal.     Mouth/Throat:     Mouth: Mucous membranes are moist.     Pharynx: Oropharynx is clear.  Eyes:     General: No scleral icterus.       Right eye: No discharge.        Left eye: No discharge.     Conjunctiva/sclera: Conjunctivae normal.     Pupils: Pupils are equal, round, and reactive to light.  Pulmonary:     Effort: Pulmonary effort is normal. No respiratory distress.     Comments: Speaking in full sentences Musculoskeletal:        General: Normal range of motion.     Cervical back: Normal range of motion.  Skin:    Coloration: Skin is not jaundiced or pale.     Findings: No bruising, erythema, lesion or rash.  Neurological:     Mental Status: She is alert and oriented to person, place, and time. Mental status is at baseline.  Psychiatric:        Mood and Affect: Mood normal.        Behavior: Behavior normal.        Thought Content: Thought content normal.        Judgment: Judgment normal.     Results  for orders placed or performed in visit on 01/01/24  [facility] Session Summary   Collection Time: 01/01/24  3:38 PM  Result Value Ref Range   Course ID [id number]    Course Start Date 11/22/2023    Session Number 21    Course First Treatment Date 11/30/2023  3:32 PM    Course Last Treatment Date 01/01/2024  3:38 PM    Course Elapsed Days 32    Reference Point ID Breast_L_Bst    Reference Point Dosage Given to Date 10 Gy   Reference Point Session Dosage Given 2 Gy   Plan ID Breast_L_Bst    Plan Name Breast_Lt_Bst    Plan Fractions Treated to Date 5    Plan Total Fractions Prescribed 5    Plan Prescribed Dose Per Fraction 2 Gy   Plan Total Prescribed Dose 10.000000 Gy   Plan Primary Reference Point Breast_L_Bst       Assessment & Plan:   Problem List Items Addressed This Visit       Other   Morbid obesity (HCC) - Primary   Due to HTN and HLD. Would like to start oral wegovy .  Rx sent to [facility]. Recheck tolerance in about 6 weeks. Call with any concerns.       Relevant Medications   semaglutide -weight management (WEGOVY ) 1.5 MG tablet   semaglutide -weight management (WEGOVY ) 4 MG tablet (Start on 03/14/2024)     Follow up plan: Return in about 6 weeks (around 04/04/2024) for virtual OK.    This visit was completed via video visit through MyChart due to the restrictions of the COVID-19 pandemic. All issues as above were discussed and addressed. Physical exam was done as above through visual confirmation on video through MyChart. If it was felt that the patient should be evaluated in the office, they were directed there. The patient verbally consented to this visit. Location of the patient: work Location of the provider: work Those involved with this call:  Provider: Duwaine Louder, DO CMA: York Fogo, CMA, Front Desk/Registration: Claretta Maiden  Time spent on call: 15 minutes with patient face to face via video conference. More than 50% of this time was spent in counseling and coordination of care. 23 minutes total spent in review of patient's record and preparation of their chart.    "

## 2024-02-23 ENCOUNTER — Other Ambulatory Visit: Payer: Self-pay

## 2024-03-04 ENCOUNTER — Inpatient Hospital Stay: Admitting: *Deleted

## 2024-03-04 ENCOUNTER — Inpatient Hospital Stay: Admitting: Oncology

## 2024-06-11 ENCOUNTER — Ambulatory Visit: Admitting: Family Medicine

## 2024-08-21 ENCOUNTER — Ambulatory Visit: Admitting: Radiation Oncology
